# Patient Record
Sex: Female | Born: 1975 | Race: Black or African American | Hispanic: No | Marital: Single | Smoking: Current every day smoker
Health system: Southern US, Community
[De-identification: ages and names within clinical notes are randomized; demographics above are authoritative.]

## PROBLEM LIST (undated history)

## (undated) DIAGNOSIS — R1084 Generalized abdominal pain: Secondary | ICD-10-CM

## (undated) DIAGNOSIS — D869 Sarcoidosis, unspecified: Secondary | ICD-10-CM

## (undated) DIAGNOSIS — Z01419 Encounter for gynecological examination (general) (routine) without abnormal findings: Secondary | ICD-10-CM

## (undated) DIAGNOSIS — K2970 Gastritis, unspecified, without bleeding: Secondary | ICD-10-CM

## (undated) DIAGNOSIS — H547 Unspecified visual loss: Secondary | ICD-10-CM

## (undated) DIAGNOSIS — Z720 Tobacco use: Secondary | ICD-10-CM

## (undated) DIAGNOSIS — R0600 Dyspnea, unspecified: Secondary | ICD-10-CM

## (undated) DIAGNOSIS — O9832 Other infections with a predominantly sexual mode of transmission complicating childbirth: Secondary | ICD-10-CM

## (undated) HISTORY — PX: TUBAL LIGATION: SHX77

## (undated) HISTORY — DX: Sarcoidosis, unspecified: D86.9

## (undated) HISTORY — DX: Dyspnea, unspecified: R06.00

## (undated) HISTORY — DX: Tobacco use: Z72.0

## (undated) HISTORY — DX: Other infections with a predominantly sexual mode of transmission complicating childbirth: O98.32

## (undated) HISTORY — DX: Encounter for gynecological examination (general) (routine) without abnormal findings: Z01.419

## (undated) HISTORY — DX: Unspecified visual loss: H54.7

## (undated) HISTORY — PX: OTHER SURGICAL HISTORY: SHX169

## (undated) HISTORY — DX: Gastritis, unspecified, without bleeding: K29.70

## (undated) HISTORY — DX: Generalized abdominal pain: R10.84

---

## 1998-04-14 ENCOUNTER — Other Ambulatory Visit: Admission: RE | Admit: 1998-04-14 | Discharge: 1998-04-14 | Payer: Self-pay | Admitting: Family Medicine

## 1998-04-17 ENCOUNTER — Inpatient Hospital Stay (HOSPITAL_COMMUNITY): Admission: AD | Admit: 1998-04-17 | Discharge: 1998-04-17 | Payer: Self-pay | Admitting: *Deleted

## 1998-07-04 ENCOUNTER — Inpatient Hospital Stay (HOSPITAL_COMMUNITY): Admission: AD | Admit: 1998-07-04 | Discharge: 1998-07-04 | Payer: Self-pay | Admitting: *Deleted

## 1999-01-04 ENCOUNTER — Inpatient Hospital Stay (HOSPITAL_COMMUNITY): Admission: AD | Admit: 1999-01-04 | Discharge: 1999-01-04 | Payer: Self-pay | Admitting: Obstetrics & Gynecology

## 1999-01-17 ENCOUNTER — Inpatient Hospital Stay (HOSPITAL_COMMUNITY): Admission: AD | Admit: 1999-01-17 | Discharge: 1999-01-17 | Payer: Self-pay | Admitting: Obstetrics

## 1999-01-17 ENCOUNTER — Encounter: Payer: Self-pay | Admitting: Obstetrics

## 1999-01-28 ENCOUNTER — Inpatient Hospital Stay (HOSPITAL_COMMUNITY): Admission: AD | Admit: 1999-01-28 | Discharge: 1999-01-31 | Payer: Self-pay | Admitting: Obstetrics

## 2000-08-22 ENCOUNTER — Observation Stay (HOSPITAL_COMMUNITY): Admission: AD | Admit: 2000-08-22 | Discharge: 2000-08-23 | Payer: Self-pay | Admitting: *Deleted

## 2000-12-28 ENCOUNTER — Emergency Department (HOSPITAL_COMMUNITY): Admission: EM | Admit: 2000-12-28 | Discharge: 2000-12-29 | Payer: Self-pay | Admitting: Emergency Medicine

## 2001-02-21 ENCOUNTER — Encounter: Payer: Self-pay | Admitting: Emergency Medicine

## 2001-02-21 ENCOUNTER — Inpatient Hospital Stay (HOSPITAL_COMMUNITY): Admission: AD | Admit: 2001-02-21 | Discharge: 2001-02-25 | Payer: Self-pay | Admitting: Obstetrics and Gynecology

## 2002-01-06 ENCOUNTER — Encounter: Payer: Self-pay | Admitting: *Deleted

## 2002-01-06 ENCOUNTER — Inpatient Hospital Stay (HOSPITAL_COMMUNITY): Admission: AD | Admit: 2002-01-06 | Discharge: 2002-01-06 | Payer: Self-pay | Admitting: *Deleted

## 2002-02-11 ENCOUNTER — Encounter (INDEPENDENT_AMBULATORY_CARE_PROVIDER_SITE_OTHER): Payer: Self-pay

## 2002-02-11 ENCOUNTER — Inpatient Hospital Stay (HOSPITAL_COMMUNITY): Admission: AD | Admit: 2002-02-11 | Discharge: 2002-02-11 | Payer: Self-pay | Admitting: Obstetrics and Gynecology

## 2002-08-06 ENCOUNTER — Ambulatory Visit (HOSPITAL_COMMUNITY): Admission: RE | Admit: 2002-08-06 | Discharge: 2002-08-06 | Payer: Self-pay | Admitting: *Deleted

## 2002-08-26 ENCOUNTER — Encounter: Admission: RE | Admit: 2002-08-26 | Discharge: 2002-08-26 | Payer: Self-pay | Admitting: *Deleted

## 2002-08-27 ENCOUNTER — Ambulatory Visit (HOSPITAL_COMMUNITY): Admission: RE | Admit: 2002-08-27 | Discharge: 2002-08-27 | Payer: Self-pay | Admitting: Obstetrics and Gynecology

## 2002-09-15 ENCOUNTER — Encounter: Admission: RE | Admit: 2002-09-15 | Discharge: 2002-09-15 | Payer: Self-pay | Admitting: *Deleted

## 2002-10-13 ENCOUNTER — Inpatient Hospital Stay (HOSPITAL_COMMUNITY): Admission: AD | Admit: 2002-10-13 | Discharge: 2002-10-15 | Payer: Self-pay | Admitting: *Deleted

## 2002-10-13 ENCOUNTER — Encounter (INDEPENDENT_AMBULATORY_CARE_PROVIDER_SITE_OTHER): Payer: Self-pay | Admitting: *Deleted

## 2004-01-09 ENCOUNTER — Encounter: Payer: Self-pay | Admitting: Emergency Medicine

## 2004-01-09 ENCOUNTER — Inpatient Hospital Stay (HOSPITAL_COMMUNITY): Admission: AD | Admit: 2004-01-09 | Discharge: 2004-01-09 | Payer: Self-pay | Admitting: Family Medicine

## 2004-01-09 ENCOUNTER — Encounter (INDEPENDENT_AMBULATORY_CARE_PROVIDER_SITE_OTHER): Payer: Self-pay | Admitting: Specialist

## 2004-12-26 ENCOUNTER — Ambulatory Visit (HOSPITAL_COMMUNITY): Admission: RE | Admit: 2004-12-26 | Discharge: 2004-12-26 | Payer: Self-pay | Admitting: *Deleted

## 2005-01-03 ENCOUNTER — Ambulatory Visit: Payer: Self-pay | Admitting: Family Medicine

## 2005-01-17 ENCOUNTER — Ambulatory Visit: Payer: Self-pay | Admitting: Family Medicine

## 2005-01-31 ENCOUNTER — Observation Stay (HOSPITAL_COMMUNITY): Admission: AD | Admit: 2005-01-31 | Discharge: 2005-02-01 | Payer: Self-pay | Admitting: *Deleted

## 2005-01-31 ENCOUNTER — Ambulatory Visit: Payer: Self-pay | Admitting: Family Medicine

## 2005-01-31 ENCOUNTER — Ambulatory Visit: Payer: Self-pay | Admitting: Obstetrics & Gynecology

## 2005-02-28 ENCOUNTER — Ambulatory Visit: Payer: Self-pay | Admitting: Family Medicine

## 2005-03-21 ENCOUNTER — Ambulatory Visit: Payer: Self-pay | Admitting: Family Medicine

## 2005-04-04 ENCOUNTER — Ambulatory Visit (HOSPITAL_COMMUNITY): Admission: RE | Admit: 2005-04-04 | Discharge: 2005-04-04 | Payer: Self-pay | Admitting: Family Medicine

## 2005-04-15 ENCOUNTER — Inpatient Hospital Stay (HOSPITAL_COMMUNITY): Admission: AD | Admit: 2005-04-15 | Discharge: 2005-04-18 | Payer: Self-pay | Admitting: *Deleted

## 2005-04-15 ENCOUNTER — Ambulatory Visit: Payer: Self-pay | Admitting: *Deleted

## 2005-04-16 ENCOUNTER — Encounter (INDEPENDENT_AMBULATORY_CARE_PROVIDER_SITE_OTHER): Payer: Self-pay | Admitting: *Deleted

## 2007-08-06 ENCOUNTER — Encounter (INDEPENDENT_AMBULATORY_CARE_PROVIDER_SITE_OTHER): Payer: Self-pay | Admitting: Nurse Practitioner

## 2007-11-21 ENCOUNTER — Emergency Department (HOSPITAL_COMMUNITY): Admission: EM | Admit: 2007-11-21 | Discharge: 2007-11-21 | Payer: Self-pay | Admitting: Emergency Medicine

## 2007-12-21 ENCOUNTER — Ambulatory Visit: Payer: Self-pay | Admitting: Nurse Practitioner

## 2007-12-21 DIAGNOSIS — K2970 Gastritis, unspecified, without bleeding: Secondary | ICD-10-CM | POA: Insufficient documentation

## 2007-12-21 DIAGNOSIS — K2990 Gastroduodenitis, unspecified, without bleeding: Secondary | ICD-10-CM

## 2007-12-21 DIAGNOSIS — F17200 Nicotine dependence, unspecified, uncomplicated: Secondary | ICD-10-CM

## 2008-05-19 ENCOUNTER — Ambulatory Visit: Payer: Self-pay | Admitting: Nurse Practitioner

## 2008-05-19 DIAGNOSIS — A64 Unspecified sexually transmitted disease: Secondary | ICD-10-CM | POA: Insufficient documentation

## 2008-05-19 DIAGNOSIS — R1084 Generalized abdominal pain: Secondary | ICD-10-CM | POA: Insufficient documentation

## 2008-05-19 DIAGNOSIS — H547 Unspecified visual loss: Secondary | ICD-10-CM

## 2008-05-19 DIAGNOSIS — K029 Dental caries, unspecified: Secondary | ICD-10-CM | POA: Insufficient documentation

## 2008-05-19 LAB — CONVERTED CEMR LAB
ALT: 30 units/L (ref 0–35)
AST: 55 units/L — ABNORMAL HIGH (ref 0–37)
Albumin: 4.5 g/dL (ref 3.5–5.2)
Alkaline Phosphatase: 67 units/L (ref 39–117)
Amylase: 132 units/L — ABNORMAL HIGH (ref 0–105)
Basophils Absolute: 0 10*3/uL (ref 0.0–0.1)
Basophils Relative: 1 % (ref 0–1)
Bilirubin Urine: NEGATIVE
Blood in Urine, dipstick: NEGATIVE
Eosinophils Absolute: 0.2 10*3/uL (ref 0.0–0.7)
Eosinophils Relative: 7 % — ABNORMAL HIGH (ref 0–5)
GC Probe Amp, Genital: NEGATIVE
Glucose, Bld: 68 mg/dL — ABNORMAL LOW (ref 70–99)
Glucose, Urine, Semiquant: NEGATIVE
HCT: 42.7 % (ref 36.0–46.0)
KOH Prep: NEGATIVE
LDL Cholesterol: 58 mg/dL (ref 0–99)
Lipase: 16 units/L (ref 0–75)
Lymphs Abs: 1.4 10*3/uL (ref 0.7–4.0)
MCV: 98.8 fL (ref 78.0–100.0)
Neutrophils Relative %: 29 % — ABNORMAL LOW (ref 43–77)
Platelets: 257 10*3/uL (ref 150–400)
Potassium: 4.5 meq/L (ref 3.5–5.3)
Protein, U semiquant: 30
RDW: 13 % (ref 11.5–15.5)
Sodium: 141 meq/L (ref 135–145)
Specific Gravity, Urine: 1.02
TSH: 1.534 microintl units/mL (ref 0.350–4.50)
Total Bilirubin: 0.4 mg/dL (ref 0.3–1.2)
Total Protein: 8.3 g/dL (ref 6.0–8.3)
Triglycerides: 88 mg/dL (ref <150)
VLDL: 18 mg/dL (ref 0–40)
WBC: 3.2 10*3/uL — ABNORMAL LOW (ref 4.0–10.5)
pH: 6

## 2008-06-09 ENCOUNTER — Telehealth (INDEPENDENT_AMBULATORY_CARE_PROVIDER_SITE_OTHER): Payer: Self-pay | Admitting: Nurse Practitioner

## 2008-06-10 ENCOUNTER — Encounter (INDEPENDENT_AMBULATORY_CARE_PROVIDER_SITE_OTHER): Payer: Self-pay | Admitting: Nurse Practitioner

## 2008-06-27 ENCOUNTER — Encounter (INDEPENDENT_AMBULATORY_CARE_PROVIDER_SITE_OTHER): Payer: Self-pay | Admitting: Nurse Practitioner

## 2008-08-11 ENCOUNTER — Encounter (INDEPENDENT_AMBULATORY_CARE_PROVIDER_SITE_OTHER): Payer: Self-pay | Admitting: *Deleted

## 2009-06-07 ENCOUNTER — Inpatient Hospital Stay (HOSPITAL_COMMUNITY): Admission: EM | Admit: 2009-06-07 | Discharge: 2009-06-09 | Payer: Self-pay | Admitting: Emergency Medicine

## 2009-06-08 ENCOUNTER — Ambulatory Visit: Payer: Self-pay | Admitting: Infectious Diseases

## 2010-04-29 ENCOUNTER — Inpatient Hospital Stay (HOSPITAL_COMMUNITY): Admission: EM | Admit: 2010-04-29 | Discharge: 2010-05-02 | Payer: Self-pay | Admitting: Emergency Medicine

## 2010-04-29 ENCOUNTER — Ambulatory Visit: Payer: Self-pay | Admitting: Pulmonary Disease

## 2010-05-01 ENCOUNTER — Encounter: Payer: Self-pay | Admitting: Adult Health

## 2010-05-02 ENCOUNTER — Encounter (INDEPENDENT_AMBULATORY_CARE_PROVIDER_SITE_OTHER): Payer: Self-pay | Admitting: Internal Medicine

## 2010-05-02 ENCOUNTER — Encounter: Payer: Self-pay | Admitting: Pulmonary Disease

## 2010-05-18 ENCOUNTER — Telehealth: Payer: Self-pay | Admitting: Adult Health

## 2010-05-24 ENCOUNTER — Ambulatory Visit: Payer: Self-pay | Admitting: Pulmonary Disease

## 2010-05-24 DIAGNOSIS — D869 Sarcoidosis, unspecified: Secondary | ICD-10-CM

## 2010-05-24 DIAGNOSIS — R0602 Shortness of breath: Secondary | ICD-10-CM | POA: Insufficient documentation

## 2010-05-28 ENCOUNTER — Telehealth (INDEPENDENT_AMBULATORY_CARE_PROVIDER_SITE_OTHER): Payer: Self-pay | Admitting: *Deleted

## 2010-05-28 LAB — CONVERTED CEMR LAB
ALT: 36 units/L — ABNORMAL HIGH (ref 0–35)
AST: 59 units/L — ABNORMAL HIGH (ref 0–37)
Total Bilirubin: 0.5 mg/dL (ref 0.3–1.2)
Total Protein: 8.3 g/dL (ref 6.0–8.3)

## 2010-05-29 ENCOUNTER — Ambulatory Visit: Payer: Self-pay | Admitting: Pulmonary Disease

## 2010-07-09 ENCOUNTER — Ambulatory Visit: Payer: Self-pay | Admitting: Pulmonary Disease

## 2010-09-25 ENCOUNTER — Telehealth: Payer: Self-pay | Admitting: Adult Health

## 2010-12-09 ENCOUNTER — Encounter: Payer: Self-pay | Admitting: *Deleted

## 2010-12-18 NOTE — Progress Notes (Signed)
Summary: nos appt  Phone Note Call from Patient   Caller: [email or web address]  Call For: parrett Summary of Call: LMTCB x2 to rsc nos from 6/30. Initial call taken by: Darletta Moll,  May 18, 2010 2:57 PM

## 2010-12-18 NOTE — Progress Notes (Signed)
Summary: nos appt  Phone Note Call from Patient   Caller: [email or web address]  Call For: parrett Summary of Call: ATC pt to rsc nos from 11/7, pt doesn't reside at number listed. Initial call taken by: Darletta Moll,  September 25, 2010 9:43 AM

## 2010-12-18 NOTE — Progress Notes (Signed)
Summary: sarcoid rash  Phone Note Outgoing Call Call back at [phone]   Call placed by: Boone Master CNA/MA,  May 28, 2010 12:57 PM Call placed to: Patient Summary of Call: called spoke with patient re: lab and cxr results.  pt c/o sarcoid rash on her back, legs and beginning to show on her arms.  pt states that her SOB has improved but is still present.  please advise, thanks! Initial call taken by: Boone Master CNA/MA,  May 28, 2010 12:59 PM  Follow-up for Phone Call        do not remember a rash at ov  if new onset rash, will need ov  may not be related to sarcoid, not characteristic to have generalized rash w/ sarcoid.  suppose to see Vassie Loll in follow up , may need ov w/ him to decide if tx w/ sarcoid necessary to start.  Follow-up by: Rubye Oaks NP,  May 28, 2010 2:24 PM  Additional Follow-up for Phone Call Additional follow up Details #1::        Spoke with pt.  She states that rash is new.  I advised will need ov- offered appt with RA in [place] for tommorrow but pt refused.  First available RA after that was 7/19, so sched pt to see TP tommorrow am at 10 am. Additional Follow-up by: Vernie Murders,  May 28, 2010 3:01 PM

## 2010-12-18 NOTE — Assessment & Plan Note (Signed)
Summary: NP follow up - sarcoid rash   CC:  sarcoid rash on back and arms and legs x1week.  History of Present Illness: 35 yo female seen for initial pulmonary consult duirng hospitalization 04/29/10 for Interstitial lung dz.   May 24, 2010 --Pt was admitted 04/29/10 for increased dyspnea. CXR and CT showed interstitial lung dz. Pt underwent FOB w/ bx.  Path showed several non caseating granulomata associated w/ mulitnucleated giant cells. Appearance favored Sarcoidosis. ACE level was 212. PT was tx w/ pulmonary hygiene regimen. Discharged on steroid taper which she has now finished. She does have intermittent dyspnea, wears out easily. She does continue to smoke. and drink alcohol daily. She denies any cocaine use since discharge. She is not ready to quit smoking yet. We discussed Sarcoid dz process. Denies known FH. Hospital workup showed neg HIV, Hepatitis panel neg, Mild LFTs increase. Denies chest pain, hemoptysis, fever, n/v/d, edema, headache. PFT showed FEV1 at 1.64 (56%), positive BD response. ratio of 81. DLCO 43  May 29, 2010 --Returns for work in visit. Contnues to have dyspnea, wears out easily, low energy, dry cough. She has noticed several hypopigmented spots on legs and arms over last visit. Concerned that this is from sarcoid. Not sure how long she has had this but noticed if after last visit in office last week. Has several bug bites as well that is pruritic. Denies chest pain, dyspnea, orthopnea, hemoptysis, fever, n/v/d, edema, headache. She continues to smoke and drink daily. Not ready to quit but hs cut down. Has used drugs since hospital discharge. Denies chest pain, dyspnea, orthopnea, hemoptysis, fever, n/v/d, edema, headache,visual changes. Has upcoming ov w/ [facility]. We discussed her looking into medicaid-she has no insurance.   Preventive Screening-Counseling & Management  Alcohol-Tobacco     Alcohol type: beer     Smoking Status: current     Smoking Cessation  Counseling: yes     Smoke Cessation Stage: precontemplative     Packs/Day: <0.25     Year Started: 1993  Medications Prior to Update: 1)  Nexium 40 Mg  Cpdr (Esomeprazole Magnesium) .Marland Kitchen.. 1 Tablet By Mouth Two Times A Day X 1 Week Then By Mouth Daily 2)  Famotidine 20 Mg Tabs (Famotidine) .... Take 1 Tab By Mouth At Bedtime 3)  Ventolin Hfa 108 (90 Base) Mcg/act Aers (Albuterol Sulfate) .... Inhale 2 Puffs Every Four Hours As Needed  Current Medications (verified): 1)  Nexium 40 Mg  Cpdr (Esomeprazole Magnesium) .Marland Kitchen.. 1 Tablet By Mouth Two Times A Day X 1 Week Then By Mouth Daily 2)  Famotidine 20 Mg Tabs (Famotidine) .... Take 1 Tab By Mouth At Bedtime 3)  Ventolin Hfa 108 (90 Base) Mcg/act Aers (Albuterol Sulfate) .... Inhale 2 Puffs Every Four Hours As Needed  Allergies (verified): No Known Drug Allergies  Past History:  Family History: Last updated: 05/24/2010 asthma - mat aunts x2 rheumatism - MGF cancer - mat aunts x2 w/ breast cancer DM - MGF, cousin  Social History: Last updated: 05/29/2010 6 children Current Smoker - 1 cig per day Alcohol use-yes ( six pack per day) Drug use-hx last use june 2011.  L[place] in [place].  Single   Risk Factors: Caffeine Use: 1 (12/21/2007)  Risk Factors: Smoking Status: current (05/29/2010) Packs/Day: <0.25 (05/29/2010)  Past Medical History:  PULMONARY SARCOIDOSIS (ICD-135) --Pt was admitted 04/29/10 for increased dyspnea. CXR and CT showed interstitial lung dz. Pt underwent FOB w/ bx.  Path showed several non caseating granulomata associated w/  mulitnucleated giant cells. Appearance favored Sarcoidosis. ACE level was 212. PFT showed FEV1 at 1.64 (56%), positive BD response. ratio of 81. DLCO 43 >>>Prednisone 10mg  once daily started May 29, 2010   DYSPNEA (ICD-786.05) ABDOMINAL PAIN, GENERALIZED (ICD-789.07) SEXUALLY TRANSMITTED DISEASE (ICD-099.9) VISUAL ACUITY, DECREASED (ICD-369.9) DENTAL CARIES (ICD-521.00) ROUTINE  GYNECOLOGICAL EXAMINATION (ICD-V72.31) TOBACCO ABUSE (ICD-305.1) GASTRITIS (ICD-535.50)    Social History: 6 children Current Smoker - 1 cig per day Alcohol use-yes ( six pack per day) Drug use-hx last use june 2011.  Lives in [place].  Single   Packs/Day:  <0.25  Review of Systems      See HPI  Vital Signs:  Patient profile:   35 year old female Height:      66 inches Weight:      134.50 pounds BMI:     21.79 O2 Sat:      93 % on Room air Temp:     98.6 degrees F oral Pulse rate:   86 / minute BP sitting:   96 / 60  Vitals Entered By: Boone Master CNA/MA (May 29, 2010 10:08 AM)  O2 Flow:  Room air CC: sarcoid rash on back, arms and legs x1week Is Patient Diabetic? No Comments Medications reviewed with patient Daytime contact number verified with patient. Boone Master CNA/MA  May 29, 2010 10:09 AM    Physical Exam  Additional Exam:  GEN: A/Ox3; pleasant , NAD HEENT:  [place]/AT, , EACs-clear, TMs-wnl, NOSE-clear, THROAT-clear NECK:  Supple w/ fair ROM; no JVD; normal carotid impulses w/o bruits; no thyromegaly or nodules palpated; no lymphadenopathy. RESP  CTA w/ decreased BS in bases  CARD:  RRR, no m/r/g   GI:   Soft & nt; nml bowel sounds; no organomegaly or masses detected. Musco: Warm bil,  no calf tenderness edema, clubbing, pulses intact Neuro: intact w/ no focal deficits noted.  SKin: several scattered hypopigmented spots along lower legs and forearms. no nodules noted.    Impression & Recommendations:  Problem # 1:  PULMONARY SARCOIDOSIS (ICD-135) Skin hypopigmented areas does not look like sarcoid related, no erythema nodosum noted.  will cont to follow .  She continues to have pulmonary complaints w/ fatigue, dyspnea and dry cough  will start steroids w/ prednisone 10mg  once daily  steroid education given.  follow up Dr. Vassie Loll in 1 month Dr. Vassie Loll   Medications Added to Medication List This Visit: 1)  Prednisone 10 Mg Tabs (Prednisone) .Marland Kitchen.. 1 by  mouth once daily  Complete Medication List: 1)  Nexium 40 Mg Cpdr (Esomeprazole magnesium) .Marland Kitchen.. 1 tablet by mouth two times a day x 1 week then by mouth daily 2)  Famotidine 20 Mg Tabs (Famotidine) .... Take 1 tab by mouth at bedtime 3)  Ventolin Hfa 108 (90 Base) Mcg/act Aers (Albuterol sulfate) .... Inhale 2 puffs every four hours as needed 4)  Prednisone 10 Mg Tabs (Prednisone) .Marland Kitchen.. 1 by mouth once daily  Other Orders: Est. Patient Level II ([address])  Patient Instructions: 1)  Begin Prednisone 10mg  once daily -take w/ food in am.  2)  Follow up w/ Dr. Vassie Loll in 1 month as scheduled.  3)  Please contact office for sooner follow up if symptoms do not improve or worsen  Prescriptions: PREDNISONE 10 MG TABS (PREDNISONE) 1 by mouth once daily  #30 x 1   Entered and Authorized by:   Rubye Oaks NP   Signed by:   Imraan Wendell NP on 05/29/2010   Method used:   Electronically  to        [facility] [phone]* (retail)       [address]       [place], [place]  [address]       Ph: [phone]       Fax: [phone]   RxID:   [id number]

## 2010-12-18 NOTE — Assessment & Plan Note (Signed)
Summary: 6 weeks/apc   Visit Type:  Follow-up Primary Angelette Ganus/Referring Meeah Totino:  n/a  CC:  Pt here for follow up. Pt states Prednisone helps with breathing.  History of Present Illness: 34/F, smoker , polysubstance abuse for FU of sarcoidosis  May 24, 2010 --Pt was admitted 04/29/10 for increased dyspnea. CXR and CT showed interstitial lung dz. Pt underwent FOB w/ bx.  Path showed several non caseating granulomata associated w/ mulitnucleated giant cells. Appearance favored Sarcoidosis. ACE level was 212. Discharged on steroid taper which she has now finished. She does have intermittent dyspnea, wears out easily. She does continue to smoke. and drink alcohol daily. She denies any cocaine use since discharge. She is not ready to quit smoking yet.  Hospital workup showed neg HIV, Hepatitis panel neg, Mild LFTs increase.  PFT showed FEV1 at 1.64 (56%), positive BD response. ratio of 81. DLCO 43  May 29, 2010 --Returns for work in visit. Contnues to have dyspnea, wears out easily, low energy, dry cough. She has noticed several hypopigmented spots on legs and arms over last visit. Concerned that this is from sarcoid-- > startde on 10 mg prednisone  July 09, 2010 2:29 PM  -ran out of prednisone, smokes 1-2 cigs/d,  no ETOH, c/o incresaed dyspnea CXR - unchanged interstitial pattern  Preventive Screening-Counseling & Management  Alcohol-Tobacco     Alcohol drinks/day: 0     Smoking Status: current     Packs/Day: <0.25     Year Started: 1993  Caffeine-Diet-Exercise     Caffeine use/day: 1  Current Medications (verified): 1)  Ventolin Hfa 108 (90 Base) Mcg/act Aers (Albuterol Sulfate) .... Inhale 2 Puffs Every Four Hours As Needed  Allergies (verified): No Known Drug Allergies  Past History:  Past Surgical History: Last updated: 12/21/2007 Tubal ligation  Social History: Last updated: 05/29/2010 6 children Current Smoker - 1 cig per day Alcohol use-yes ( six pack per  day) Drug use-hx last use june 2011.  Lives in [place].  Single   Social History: Alcohol drinks/day:  0  Review of Systems       The patient complains of dyspnea on exertion.  The patient denies anorexia, fever, weight loss, weight gain, vision loss, decreased hearing, hoarseness, chest pain, syncope, peripheral edema, prolonged cough, headaches, hemoptysis, abdominal pain, melena, hematochezia, severe indigestion/heartburn, hematuria, muscle weakness, suspicious skin lesions, transient blindness, difficulty walking, depression, unusual weight change, and abnormal bleeding.    Vital Signs:  Patient profile:   35 year old female Height:      66 inches Weight:      134 pounds BMI:     21.71 O2 Sat:      98 % on Room air Temp:     98.3 degrees F oral Pulse rate:   80 / minute BP sitting:   128 / 76  (left arm) Cuff size:   regular  Vitals Entered By: Zackery Barefoot CMA (July 09, 2010 2:08 PM)  O2 Flow:  Room air CC: Pt here for follow up. Pt states Prednisone helps with breathing Comments Medications reviewed with patient Verified contact number and pharmacy with patient Zackery Barefoot CMA  July 09, 2010 2:10 PM    Physical Exam  Additional Exam:  GEN: A/Ox3; pleasant , NAD HEENT:  [place]/AT, , EACs-clear, TMs-wnl, NOSE-clear, THROAT-clear NECK:  Supple w/ fair ROM; no JVD; normal carotid impulses w/o bruits; no thyromegaly or nodules palpated; no lymphadenopathy. RESP  CTA w/ decreased BS in bases  CARD:  RRR,  no m/r/g   Musco: Warm bil,  no calf tenderness edema, clubbing, pulses intact SKin: several scattered hypopigmented spots along lower legs and forearms. no nodules noted.    CXR  Procedure date:  07/09/2010  Findings:      IMPRESSION: Chronic scarring particularly the bases.  No superimposed acute abnormality.  Impression & Recommendations:  Problem # 1:  PULMONARY SARCOIDOSIS (ICD-135) stay on 10 mg pred x 3 months - then based on symptoms , atempt slow  taper.  Problem # 2:  TOBACCO ABUSE (ICD-305.1) -discussed cessation. Not ready to commit to a quit date  Problem # 3:  GASTRITIS (ICD-535.50) omeprazole for GERD The following medications were removed from the medication list:    Nexium 40 Mg Cpdr (Esomeprazole magnesium) .Marland Kitchen... 1 tablet by mouth two times a day x 1 week then by mouth daily    Famotidine 20 Mg Tabs (Famotidine) .Marland Kitchen... Take 1 tab by mouth at bedtime Her updated medication list for this problem includes:    Omeprazole Magnesium 20.6 (20 Base) Mg Cpdr (Omeprazole magnesium) ..... Once daily  Medications Added to Medication List This Visit: 1)  Prednisone 10 Mg Tabs (Prednisone) .... Once daily with food 2)  Omeprazole Magnesium 20.6 (20 Base) Mg Cpdr (Omeprazole magnesium) .... Once daily  Other Orders: Est. Patient Level III (04540) Prescription Created Electronically [phone]) T-2 View CXR ([id number])  Patient Instructions: 1)  Copy sent to: [facility] 2)  Please schedule a follow-up appointment in 2 months with TP 3)  QUIT smoking ! 4)  A chest x-ray has been recommended.  Your imaging study may require preauthorization.  5)  Stay on 10 mg prednisone  - Rx sent to pharmacy Prescriptions: OMEPRAZOLE MAGNESIUM 20.6 (20 BASE) MG CPDR (OMEPRAZOLE MAGNESIUM) once daily  #30 x 2   Entered and Authorized by:   Comer Locket Vassie Loll MD   Signed by:   Comer Locket Vassie Loll MD on 07/09/2010   Method used:   Electronically to        [facility] [phone]* (retail)       [address]       [place], [place]  [address]       Ph: [phone]       Fax: [phone]   RxID:   [id number] PREDNISONE 10 MG TABS (PREDNISONE) once daily with food  #60 x 1   Entered and Authorized by:   Comer Locket. Vassie Loll MD   Signed by:   Comer Locket Vassie Loll MD on 07/09/2010   Method used:   Electronically to        [facility] [phone]* (retail)       [address]       [place], [place]  [address]       Ph: [phone]       Fax: [phone]   RxID:    [id number]

## 2010-12-18 NOTE — Assessment & Plan Note (Signed)
Summary: NP follow up - post hosp   CC:  post hosp follow up - states her SOB has increased since finishing the pred taper x2weeks ago, also wheezing.  denies cough, and fc/s.  History of Present Illness: 35 yo female seen for initial pulmonary consult duirng hospitalization 04/29/10 for Interstitial lung dz.   May 24, 2010 --Pt was admitted 04/29/10 for increased dyspnea. CXR and CT showed interstitial lung dz. Pt underwent FOB w/ bx.  Path showed several non caseating granulomata associated w/ mulitnucleated giant cells. Appearance favored Sarcoidosis. ACE level was 212. PT was tx w/ pulmonary hygiene regimen. Discharged on steroid taper which she has now finished. She does have intermittent dyspnea, wears out easily. She does continue to smoke. and drink alcohol daily. She denies any cocaine use since discharge. She is not ready to quit smoking yet. We discussed Sarcoid dz process. Denies known FH. Hospital workup showed neg HIV, Hepatitis panel neg, Mild LFTs increase. Denies chest pain, hemoptysis, fever, n/v/d, edema, headache. PFT showed FEV1 at 1.64 (56%), positive BD response. ratio of 81. DLCO 43  Medications Prior to Update: 1)  Nexium 40 Mg  Cpdr (Esomeprazole Magnesium) .Marland Kitchen.. 1 Tablet By Mouth Two Times A Day X 1 Week Then By Mouth Daily  Current Medications (verified): 1)  Nexium 40 Mg  Cpdr (Esomeprazole Magnesium) .Marland Kitchen.. 1 Tablet By Mouth Two Times A Day X 1 Week Then By Mouth Daily 2)  Famotidine 20 Mg Tabs (Famotidine) .... Take 1 Tab By Mouth At Bedtime 3)  Ventolin Hfa 108 (90 Base) Mcg/act Aers (Albuterol Sulfate) .... Inhale 2 Puffs Every Four Hours As Needed  Allergies (verified): No Known Drug Allergies  Past History:  Past Surgical History: Last updated: 12/21/2007 Tubal ligation  Family History: Last updated: 05/24/2010 asthma - mat aunts x2 rheumatism - MGF cancer - mat aunts x2 w/ breast cancer DM - MGF, cousin  Social History: Last updated: 05/24/2010 6  children Current Smoker - 1 cig per day Alcohol use-yes ( six pack per day) Drug use-no  Risk Factors: Smoking Status: current (12/21/2007)  Past Medical History:   PULMONARY SARCOIDOSIS (ICD-135) --Pt was admitted 04/29/10 for increased dyspnea. CXR and CT showed interstitial lung dz. Pt underwent FOB w/ bx.  Path showed several non caseating granulomata associated w/ mulitnucleated giant cells. Appearance favored Sarcoidosis. ACE level was 212 PFT showed FEV1 at 1.64 (56%), positive BD response. ratio of 81. DLCO 43 DYSPNEA (ICD-786.05) ABDOMINAL PAIN, GENERALIZED (ICD-789.07) SEXUALLY TRANSMITTED DISEASE (ICD-099.9) VISUAL ACUITY, DECREASED (ICD-369.9) DENTAL CARIES (ICD-521.00) ROUTINE GYNECOLOGICAL EXAMINATION (ICD-V72.31) TOBACCO ABUSE (ICD-305.1) GASTRITIS (ICD-535.50)    Family History: asthma - mat aunts x2 rheumatism - MGF cancer - mat aunts x2 w/ breast cancer DM - MGF, cousin  Social History: 6 children Current Smoker - 1 cig per day Alcohol use-yes ( six pack per day) Drug use-no  Review of Systems      See HPI  Vital Signs:  Patient profile:   35 year old female Height:      66 inches Weight:      138 pounds BMI:     22.35 O2 Sat:      96 % on Room air Temp:     98.4 degrees F oral Pulse rate:   89 / minute BP sitting:   100 / 78  (left arm) Cuff size:   regular  Vitals Entered By: Boone Master CNA/MA (May 24, 2010 4:13 PM)  O2 Flow:  Room air CC:  post hosp follow up - states her SOB has increased since finishing the pred taper x2weeks ago, also wheezing.  denies cough, fc/s Is Patient Diabetic? No Comments Medications reviewed with patient Daytime contact number verified with patient. Boone Master CNA/MA  May 24, 2010 4:10 PM    Ambulatory Pulse Oximetry  Resting; HR__98___    02 Sat___97__  Lap1 (185 feet)   HR__100___   02 Sat__90___ Lap2 (185 feet)   HR__100___   02 Sat__90___    Lap3 (185 feet)   HR__101___   02  Sat__90___  _X__Test Completed without Difficulty ___Test Stopped due to:  Boone Master CNA/MA  May 24, 2010 4:48 PM    Physical Exam  Additional Exam:  GEN: A/Ox3; pleasant , NAD HEENT:  [place]/AT, , EACs-clear, TMs-wnl, NOSE-clear, THROAT-clear NECK:  Supple w/ fair ROM; no JVD; normal carotid impulses w/o bruits; no thyromegaly or nodules palpated; no lymphadenopathy. RESP  CTA w/ decreased BS in bases  CARD:  RRR, no m/r/g   GI:   Soft & nt; nml bowel sounds; no organomegaly or masses detected. Musco: Warm bil,  no calf tenderness edema, clubbing, pulses intact Neuro: intact w/ no focal deficits noted.    Impression & Recommendations:  Problem # 1:  PULMONARY SARCOIDOSIS (ICD-135) no ambulatory desaturations.  pt education on sarcoid.  advised to set up w/ opthamology for eye exam.  will hold tx at this time, continue to monitor.  follow up 6 weeks Dr. Vassie Loll xray pending.   REC:  MOST IMPORTANT IS TO QUIT SMOKING AND STOP DRUG USE.  At this time we will continue to follow you, no medicine at this point.  follow up Dr. Vassie Loll in 6 weeks. and as needed  Please contact office for sooner follow up if symptoms do not improve or worsen  Orders: Pulse Oximetry, Ambulatory (47829) T-2 View CXR (71020TC) Est. Patient Level IV (56213)  Medications Added to Medication List This Visit: 1)  Famotidine 20 Mg Tabs (Famotidine) .... Take 1 tab by mouth at bedtime 2)  Ventolin Hfa 108 (90 Base) Mcg/act Aers (Albuterol sulfate) .... Inhale 2 puffs every four hours as needed  Other Orders: TLB-Hepatic/Liver Function Pnl ([id number]-[id number])  Patient Instructions: 1)  You have Sarcoidosis-  2)  We would like you to have a eye exam, you will need to see an opthamologist-tell them that you have recently been diagnosed w/ sarcoid .  3)  MOST IMPORTANT IS TO QUIT SMOKING AND STOP DRUG USE.  4)  At this time we will continue to follow you, no medicine at this point.  5)  follow up Dr. Vassie Loll in 6  weeks. and as needed  6)  Please contact office for sooner follow up if symptoms do not improve or worsen

## 2011-01-16 ENCOUNTER — Ambulatory Visit: Payer: Self-pay | Admitting: Adult Health

## 2011-01-18 ENCOUNTER — Other Ambulatory Visit: Payer: Self-pay | Admitting: Pulmonary Disease

## 2011-01-18 ENCOUNTER — Ambulatory Visit (INDEPENDENT_AMBULATORY_CARE_PROVIDER_SITE_OTHER): Payer: Self-pay | Admitting: Adult Health

## 2011-01-18 ENCOUNTER — Other Ambulatory Visit: Payer: Self-pay | Admitting: Internal Medicine

## 2011-01-18 ENCOUNTER — Encounter: Payer: Self-pay | Admitting: Adult Health

## 2011-01-18 ENCOUNTER — Ambulatory Visit (INDEPENDENT_AMBULATORY_CARE_PROVIDER_SITE_OTHER)
Admission: RE | Admit: 2011-01-18 | Discharge: 2011-01-18 | Disposition: A | Discharge status: Home / Self Care | Payer: Self-pay | Source: Ambulatory Visit | Attending: Internal Medicine | Admitting: Internal Medicine

## 2011-01-18 DIAGNOSIS — D869 Sarcoidosis, unspecified: Secondary | ICD-10-CM

## 2011-01-29 NOTE — Assessment & Plan Note (Signed)
Summary: Acute NP office visit - sarcoid   Primary Provider/Referring Provider:  n/a  CC:  increased SOB, tightness in chest, prod cough with clear mucus, increased fatigue x7month, and states this began when she finished last rx for prednisone.  History of Present Illness: 35/F, smoker Hx of polysubstance abuse for FU of sarcoidosis  May 24, 2010 --Pt was admitted 04/29/10 for increased dyspnea. CXR and CT showed interstitial lung dz. Pt underwent FOB w/ bx.  Path showed several non caseating granulomata associated w/ mulitnucleated giant cells. Appearance favored Sarcoidosis. ACE level was 212. Discharged on steroid taper which she has now finished. She does have intermittent dyspnea, wears out easily. She does continue to smoke. and drink alcohol daily. She denies any cocaine use since discharge. She is not ready to quit smoking yet.  Hospital workup showed neg HIV, Hepatitis panel neg, Mild LFTs increase.  PFT showed FEV1 at 1.64 (56%), positive BD response. ratio of 81. DLCO 43  May 29, 2010 --Returns for work in visit. Contnues to have dyspnea, wears out easily, low energy, dry cough. She has noticed several hypopigmented spots on legs and arms over last visit. Concerned that this is from sarcoid-- > startde on 10 mg prednisone  July 09, 2010 2:29 PM  -ran out of prednisone, smokes 1-2 cigs/d,  no ETOH, c/o incresaed dyspnea CXR - unchanged interstitial pattern>>rec to stay on pred 10mg  once daily    January 18, 2011 --Presents for an acute office visit.Complains of increased SOB, tightness in chest, prod cough with clear mucus, increased fatigue x24month. She is very nervous and jittery today, asked if she is using drugs again -she denies. She stopped prednisone few months ago.  Drinks alcohol daily. No otc used. No discolored mucus or fever.   Medications Prior to Update: 1)  Ventolin Hfa 108 (90 Base) Mcg/act Aers (Albuterol Sulfate) .... Inhale 2 Puffs Every Four Hours As  Needed  Current Medications (verified): 1)  Ventolin Hfa 108 (90 Base) Mcg/act Aers (Albuterol Sulfate) .... Inhale 2 Puffs Every Four Hours As Needed  Allergies (verified): No Known Drug Allergies  Past History:  Past Medical History: Last updated: 05/29/2010  PULMONARY SARCOIDOSIS (ICD-135) --Pt was admitted 04/29/10 for increased dyspnea. CXR and CT showed interstitial lung dz. Pt underwent FOB w/ bx.  Path showed several non caseating granulomata associated w/ mulitnucleated giant cells. Appearance favored Sarcoidosis. ACE level was 212. PFT showed FEV1 at 1.64 (56%), positive BD response. ratio of 81. DLCO 43 >>>Prednisone 10mg  once daily started May 29, 2010   DYSPNEA (ICD-786.05) ABDOMINAL PAIN, GENERALIZED (ICD-789.07) SEXUALLY TRANSMITTED DISEASE (ICD-099.9) VISUAL ACUITY, DECREASED (ICD-369.9) DENTAL CARIES (ICD-521.00) ROUTINE GYNECOLOGICAL EXAMINATION (ICD-V72.31) TOBACCO ABUSE (ICD-305.1) GASTRITIS (ICD-535.50)    Past Surgical History: Last updated: 12/21/2007 Tubal ligation  Family History: Last updated: 05/24/2010 asthma - mat aunts x2 rheumatism - MGF cancer - mat aunts x2 w/ breast cancer DM - MGF, cousin  Social History: Last updated: 05/29/2010 6 children Current Smoker - 1 cig per day Alcohol use-yes ( six pack per day) Drug use-hx last use june 2011.  Lives in [place].  Single   Risk Factors: Smoking Status: current (07/09/2010) Packs/Day: <0.25 (07/09/2010)  Review of Systems      See HPI  Vital Signs:  Patient profile:   35 year old female Height:      66 inches Weight:      138.50 pounds BMI:     22.44 O2 Sat:      98 %  on Room air Temp:     97.7 degrees F oral Pulse rate:   77 / minute BP sitting:   104 / 76  (left arm) Cuff size:   regular  Vitals Entered By: Boone Master CNA/MA (January 18, 2011 12:05 PM)  O2 Flow:  Room air CC: increased SOB, tightness in chest, prod cough with clear mucus, increased fatigue x1month, states  this began when she finished last rx for prednisone Is Patient Diabetic? No Comments Medications reviewed with patient Daytime contact number verified with patient. Boone Master CNA/MA  January 18, 2011 12:05 PM    Physical Exam  Additional Exam:  GEN: A/Ox3; pleasant , NAD HEENT:  Rachel/AT, , EACs-clear, TMs-wnl, NOSE-clear, THROAT-clear NECK:  Supple w/ fair ROM; no JVD; normal carotid impulses w/o bruits; no thyromegaly or nodules palpated; no lymphadenopathy. RESP  CTA w/ decreased BS in bases  CARD:  RRR, no m/r/g   Musco: Warm bil,  no calf tenderness edema, clubbing, pulses intact SKin: no rash or . nodules noted.Multiple eccymotics areas along forearm-wrist/antecubital area  Psych: anxious , jittery    Impression & Recommendations:  Problem # 1:  PULMONARY SARCOIDOSIS (ICD-135) Flare discussed importance of not stopping steroids abruptly.  will check xray and ACE /LFTs  Plan :  Restart Prednsone 20mg  once daily for 2 weeks then 1/2 once daily and hold at this dose.  follow up Dr. Vassie Loll in 3 weeks I will call with labs and xray results Very IMPORTANT TO STOP SMOKING. Marland KitchenPlease contact office for sooner follow up if symptoms do not improve or worsen   Medications Added to Medication List This Visit: 1)  Prednisone 20 Mg Tabs (Prednisone) .Marland Kitchen.. 1 by mouth once daily x 2 weeks , then 1/2 once daily  Other Orders: T-2 View CXR (71020TC) TLB-Hepatic/Liver Function Pnl (80076-HEPATIC) T-Angiotensin i-Converting Enzyme (16109-60454) TLB-BMP (Basic Metabolic Panel-BMET) (80048-METABOL) Albuterol Sulfate Sol 1mg  unit dose (U9811) Nebulizer Tx (91478) Est. Patient Level IV (29562)  Patient Instructions: 1)  Restart Prednsone 20mg  once daily for 2 weeks then 1/2 once daily and hold at this dose.  2)  follow up Dr. Vassie Loll in 3 weeks 3)  I will call with labs and xray results 4)  Very IMPORTANT TO STOP SMOKING. 5)  .Please contact office for sooner follow up if symptoms do not  improve or worsen  Prescriptions: PREDNISONE 20 MG TABS (PREDNISONE) 1 by mouth once daily x 2 weeks , then 1/2 once daily  #60 x 1   Entered and Authorized by:   Rubye Oaks NP   Signed by:   Rubye Oaks NP on 01/18/2011   Method used:   Electronically to        [facility] [phone]* (retail)       [address]       [place], [place]  [address]       Ph: [phone]       Fax: [phone]   RxID:   [id number]    Medication Administration  Medication # 1:    Medication: Albuterol Sulfate Sol 1mg  unit dose    Diagnosis: PULMONARY SARCOIDOSIS (ICD-135)    Dose: 1 vial    Route: inhaled    Exp Date: 11-2011    Lot #: [id number]    Mfr: nephron    Patient tolerated medication without complications    Given by: Boone Master CNA/MA (January 18, 2011 4:19 PM)  Orders Added: 1)  T-2 View CXR [71020TC] 2)  TLB-Hepatic/Liver Function Pnl [80076-HEPATIC] 3)  T-Angiotensin i-Converting Enzyme [16109-60454] 4)  TLB-BMP (Basic Metabolic Panel-BMET) [80048-METABOL] 5)  Albuterol Sulfate Sol 1mg  unit dose [J7613] 6)  Nebulizer Tx [94640] 7)  Est. Patient Level IV [09811]

## 2011-02-03 LAB — GLUCOSE, CAPILLARY
Glucose-Capillary: 112 mg/dL — ABNORMAL HIGH (ref 70–99)
Glucose-Capillary: 149 mg/dL — ABNORMAL HIGH (ref 70–99)

## 2011-02-03 LAB — CULTURE, RESPIRATORY W GRAM STAIN

## 2011-02-03 LAB — FUNGUS CULTURE W SMEAR

## 2011-02-03 LAB — AFB CULTURE WITH SMEAR (NOT AT ARMC): Acid Fast Smear: NONE SEEN

## 2011-02-04 LAB — CBC
HCT: 35.7 % — ABNORMAL LOW (ref 36.0–46.0)
HCT: 39.9 % (ref 36.0–46.0)
Hemoglobin: 12.3 g/dL (ref 12.0–15.0)
Hemoglobin: 13.7 g/dL (ref 12.0–15.0)
MCHC: 34.5 g/dL (ref 30.0–36.0)
MCV: 97.4 fL (ref 78.0–100.0)
MCV: 97.9 fL (ref 78.0–100.0)
RBC: 3.65 MIL/uL — ABNORMAL LOW (ref 3.87–5.11)
RBC: 4.09 MIL/uL (ref 3.87–5.11)
RDW: 13.7 % (ref 11.5–15.5)
WBC: 4.4 10*3/uL (ref 4.0–10.5)

## 2011-02-04 LAB — COMPREHENSIVE METABOLIC PANEL
Alkaline Phosphatase: 114 U/L (ref 39–117)
BUN: 6 mg/dL (ref 6–23)
BUN: 8 mg/dL (ref 6–23)
CO2: 22 mEq/L (ref 19–32)
CO2: 24 mEq/L (ref 19–32)
Calcium: 8.5 mg/dL (ref 8.4–10.5)
Chloride: 106 mEq/L (ref 96–112)
Creatinine, Ser: 0.68 mg/dL (ref 0.4–1.2)
Creatinine, Ser: 0.81 mg/dL (ref 0.4–1.2)
GFR calc non Af Amer: >60 mL/min (ref >60)
GFR calc non Af Amer: >60 mL/min (ref >60)
Glucose, Bld: 222 mg/dL — ABNORMAL HIGH (ref 70–99)
Sodium: 133 mEq/L — ABNORMAL LOW (ref 135–145)
Total Bilirubin: 0.4 mg/dL (ref 0.3–1.2)
Total Protein: 7 g/dL (ref 6.0–8.3)

## 2011-02-04 LAB — CULTURE, BLOOD (ROUTINE X 2)
Culture: NO GROWTH
Culture: NO GROWTH

## 2011-02-04 LAB — HEPATITIS PANEL, ACUTE

## 2011-02-04 LAB — DIFFERENTIAL
Basophils Absolute: 0 10*3/uL (ref 0.0–0.1)
Basophils Relative: 1 % (ref 0–1)
Eosinophils Absolute: 0 10*3/uL (ref 0.0–0.7)
Eosinophils Relative: 5 % (ref 0–5)
Lymphocytes Relative: 27 % (ref 12–46)
Lymphs Abs: 0.9 10*3/uL (ref 0.7–4.0)
Monocytes Relative: 5 % (ref 3–12)
Neutro Abs: 2.2 10*3/uL (ref 1.7–7.7)
Neutro Abs: 2.4 10*3/uL (ref 1.7–7.7)
Neutrophils Relative %: 67 % (ref 43–77)

## 2011-02-04 LAB — POCT CARDIAC MARKERS: Myoglobin, poc: 120 ng/mL (ref 12–200)

## 2011-02-04 LAB — EXPECTORATED SPUTUM ASSESSMENT W GRAM STAIN, RFLX TO RESP C

## 2011-02-04 LAB — BLOOD GAS, ARTERIAL
Bicarbonate: 22.5 mEq/L (ref 20.0–24.0)
FIO2: 0.21 %
Patient temperature: 98.6
TCO2: 23.6 mmol/L (ref 0–100)
pCO2 arterial: 35.4 mmHg (ref 35.0–45.0)
pH, Arterial: 7.418 — ABNORMAL HIGH (ref 7.350–7.400)

## 2011-02-04 LAB — D-DIMER, QUANTITATIVE: D-Dimer, Quant: 1.06 ug/mL-FEU — ABNORMAL HIGH (ref 0.00–0.48)

## 2011-02-04 LAB — HIV ANTIBODY (ROUTINE TESTING W REFLEX): HIV: NONREACTIVE

## 2011-02-04 LAB — HEMOCCULT GUIAC POC 1CARD (OFFICE)
Fecal Occult Bld: NEGATIVE
Fecal Occult Bld: NEGATIVE

## 2011-02-04 LAB — GLUCOSE, CAPILLARY: Glucose-Capillary: 181 mg/dL — ABNORMAL HIGH (ref 70–99)

## 2011-02-04 LAB — ANA: Anti Nuclear Antibody(ANA): POSITIVE — AB

## 2011-02-04 LAB — C-REACTIVE PROTEIN: CRP: 0.6 mg/dL — ABNORMAL HIGH (ref <0.6)

## 2011-02-04 LAB — RAPID URINE DRUG SCREEN, HOSP PERFORMED
Amphetamines: NOT DETECTED
Cocaine: POSITIVE — AB
Tetrahydrocannabinol: NOT DETECTED

## 2011-02-04 LAB — CULTURE, RESPIRATORY W GRAM STAIN

## 2011-02-04 LAB — ANGIOTENSIN CONVERTING ENZYME: Angiotensin-Converting Enzyme: 212 U/L — ABNORMAL HIGH (ref 9–67)

## 2011-02-11 ENCOUNTER — Encounter: Payer: Self-pay | Admitting: Pulmonary Disease

## 2011-02-12 ENCOUNTER — Ambulatory Visit: Payer: Self-pay | Admitting: Pulmonary Disease

## 2011-02-24 LAB — CBC
MCHC: 34.1 g/dL (ref 30.0–36.0)
MCV: 100.1 fL — ABNORMAL HIGH (ref 78.0–100.0)
MCV: 99.7 fL (ref 78.0–100.0)
Platelets: 178 10*3/uL (ref 150–400)
Platelets: 186 10*3/uL (ref 150–400)
RBC: 3.44 MIL/uL — ABNORMAL LOW (ref 3.87–5.11)
RDW: 12.9 % (ref 11.5–15.5)
WBC: 2.9 10*3/uL — ABNORMAL LOW (ref 4.0–10.5)
WBC: 5 10*3/uL (ref 4.0–10.5)

## 2011-02-24 LAB — DIFFERENTIAL
Basophils Absolute: 0 10*3/uL (ref 0.0–0.1)
Eosinophils Relative: 4 % (ref 0–5)
Lymphocytes Relative: 15 % (ref 12–46)
Neutro Abs: 3.8 10*3/uL (ref 1.7–7.7)
Neutrophils Relative %: 76 % (ref 43–77)

## 2011-02-24 LAB — URINALYSIS, ROUTINE W REFLEX MICROSCOPIC
Bilirubin Urine: NEGATIVE
Nitrite: NEGATIVE
Specific Gravity, Urine: 1.023 (ref 1.005–1.030)
Urobilinogen, UA: 1 mg/dL (ref 0.0–1.0)

## 2011-02-24 LAB — COMPREHENSIVE METABOLIC PANEL
ALT: 24 U/L (ref 0–35)
AST: 38 U/L — ABNORMAL HIGH (ref 0–37)
CO2: 25 mEq/L (ref 19–32)
Chloride: 106 mEq/L (ref 96–112)
GFR calc Af Amer: >60 mL/min (ref >60)
GFR calc non Af Amer: >60 mL/min (ref >60)
Sodium: 138 mEq/L (ref 135–145)
Total Bilirubin: 0.3 mg/dL (ref 0.3–1.2)

## 2011-02-24 LAB — BASIC METABOLIC PANEL
BUN: 5 mg/dL — ABNORMAL LOW (ref 6–23)
BUN: 8 mg/dL (ref 6–23)
CO2: 27 mEq/L (ref 19–32)
Calcium: 9.7 mg/dL (ref 8.4–10.5)
Chloride: 106 mEq/L (ref 96–112)
Creatinine, Ser: 0.68 mg/dL (ref 0.4–1.2)
Creatinine, Ser: 0.73 mg/dL (ref 0.4–1.2)
GFR calc non Af Amer: >60 mL/min (ref >60)
Glucose, Bld: 109 mg/dL — ABNORMAL HIGH (ref 70–99)

## 2011-02-24 LAB — CULTURE, BLOOD (ROUTINE X 2)
Culture: NO GROWTH
Culture: NO GROWTH

## 2011-02-24 LAB — RAPID STREP SCREEN (MED CTR MEBANE ONLY): Streptococcus, Group A Screen (Direct): NEGATIVE

## 2011-02-24 LAB — URINE MICROSCOPIC-ADD ON

## 2011-02-24 LAB — POCT PREGNANCY, URINE: Preg Test, Ur: NEGATIVE

## 2011-04-02 NOTE — H&P (Signed)
NAMECADYN, RODGER            ACCOUNT NO.:  [id number]   MEDICAL RECORD NO.:  [id number]          PATIENT TYPE:  EMS   LOCATION:  MAJO                         FACILITY:  [facility]   PHYSICIAN:  Joylene John, MD       DATE OF BIRTH:  December 22, 1975   DATE OF ADMISSION:  06/07/2009  DATE OF DISCHARGE:                              HISTORY & PHYSICAL   The patient is being admitted to [facility], team G.   REASON FOR ADMISSION:  Rash since yesterday afternoon and fever early  this morning.   HISTORY OF PRESENT ILLNESS:  This is a 35 year old female without any  significant past medical history coming in with rash since yesterday and  fever this morning.  The patient tells me that she has been taking over-  the-counter pain medications, Aleve, Motrin, ibuprofen, a combination of  them up to 6-7 pills a day for her toothache for the last 3 weeks.  Denies any new medications.  No sick contacts.  No recent travels.  Denies any URI symptoms, so she did have some chills with fever the  today.  When the rash started, it was itchy.  The symptom has resolved  since she has received Benadryl.  She tells me that she woke up this  morning and her face was swollen.  No similar episodes in the past.   PAST MEDICAL HISTORY:  No significant past medical history.   FAMILY HISTORY:  Noncontributory.   ALLERGIES:  The patient tells me that as of now she did not know if she  has had any allergies to drugs or medicines.   SOCIAL HISTORY:  She is sexually active with boyfriend.  She has not  been tested for HIV in a while.  Smokes up to one and a half pack per  day since age 73.  Occasional beer.  No drugs.   PHYSICAL EXAMINATION:  VITAL SIGNS:  Temperature of 102.3, repeat was  101.2; blood pressure ranging from 115/72 to 108/67; pulse 92-96;  respirations 20-22; the patient is sating 96-97% on room air.  GENERAL:  The patient is in no acute distress, able to talk in  full  sentences.  HEENT:  She does have an infected tooth in the right upper part of her  mouth.  No icterus or pallor appreciated.  No JVD appreciated.  LUNGS:  Clear to auscultation posteriorly.  However, she does have  coarse breath sounds anteriorly.  CARDIOVASCULAR:  Regular rate and rhythm.  Belly is soft.  Bowel sounds  are present.  EXTREMITIES:  Lower extremities, no edema noted.  SKIN:  She does have maculopapular rash all over her body, more so on  her torso and her lower extremities.   Chest x-ray shows diffuse interstitial prominence with reticular nodular  opacities, which could be atypical infection.   LABORATORY DATA:  Labs show sodium was 135, potassium 4, chloride 100,  bicarb 26, BUN 8, creatinine 0.73, glucose 109, calcium 9.7.  UA is  clear.  Hemoglobin 13.9, crit 39.3, white count 5, platelet 186.  Rapid  strep test was  negative.  Eosinophils of note were within normal limits  at 4%.  Pregnancy test was negative.   ASSESSMENT AND PLAN:  A 35 year old female with fever, rash, and  pneumonia.  Plan is to admit the patient to the floor to a regular bed.  We will give antibiotics, ceftriaxone and azithromycin.  We will repeat  the chest x-ray in  the morning.  The patient has given permission to test for HIV.  If  there is no improvement with antibiotics and she continues to spike,  then may need to broaden antibiotic coverage and also work the patient  up for possible viral or autoimmune cause.      Joylene John, MD  Electronically Signed     RP/MEDQ  D:  06/07/2009  T:  06/08/2009  Job:  [id number]

## 2011-04-02 NOTE — Discharge Summary (Signed)
NAMEPRESCIOUS, HURLESS            ACCOUNT NO.:  [id number]   MEDICAL RECORD NO.:  [id number]          PATIENT TYPE:  INP   LOCATION:  5528                         FACILITY:  [facility]   PHYSICIAN:  Michelene Gardener, MD    DATE OF BIRTH:  26-Sep-1976   DATE OF ADMISSION:  06/07/2009  DATE OF DISCHARGE:  06/09/2009                               DISCHARGE SUMMARY   DISCHARGE DIAGNOSES:  1. Interstitial pneumonia.  2. Rash, most likely related to allergic reactions.  3. Leukopenia, resolved.   DISCHARGE MEDICATIONS:  1. Zithromax 500 mg once a day for 5 days.  2. Benadryl 25 mg q.6 h. as needed.   CONSULTATIONS:  ID consult.   PROCEDURES:  None.   DIAGNOSTIC STUDIES:  1. Chest x-ray on June 07, 2009 showed diffuse interstitial prominence      with reticular nodular density representing atypical pneumonia.  2. Repeat x-ray on June 08, 2009 showed atypical interstitial      pneumonia.  Follow up with [facility] within a week and      appointment was done for next Tuesday.   COURSE OF HOSPITALIZATION:  1. Pneumonia.  The patient was admitted to the hospital.  Was started      on Rocephin and Zithromax.  Initial x-ray was done on June 07, 2009      and results are mentioned above.  Repeat x-ray was done on June 08, 2009 and again results are mentioned above.  The patient improved      very quickly during this hospitalization.  ID recommended to switch      her medications to Zithromax p.o. to be taken for more 5 days.  2. Rash.  This is most likely secondary to allergic reaction.  This      patient recently moved and she stated that she has been around a      lot of surroundings and then she started developing her rash.  Her      rash improved with Benadryl and currently at the time of discharge,      she is almost back to her baseline.  I advised her to continue      taking Benadryl as needed.  3. Leukopenia, that was related to her underlying infection and the      matter of  fact, this improved with antibiotics.  The patient had      HIV testing which has been nonreactive.   Otherwise, other medical conditions remained stable.  The patient will  be discharged today in stable condition.   TOTAL ASSESSMENT TIME:  40 minutes.      Michelene Gardener, MD  Electronically Signed     NAE/MEDQ  D:  06/09/2009  T:  06/10/2009  Job:  [id number]

## 2011-04-05 NOTE — Discharge Summary (Signed)
[facility]  Patient:    Margaret Schneider, Margaret Schneider                   MRN: [id number] Adm. Date:  [id number] Disc. Date: [id number] Attending:  Michaelle Copas Dictator:   Marlinda Mike, CNM                           Discharge Summary  DATE OF BIRTH:                  06-Jun-1976  TIME OF ADMISSION:              1828, February 21, 2001, by Dr. Corky Sox  ADMISSION DIAGNOSIS:            Pelvic inflammatory disease with tuboovarian abscess versus ovarian cyst.  TIME OF DISCHARGE:              1700 on February 25, 2001, from teaching service per Dr. Clearance Coots.  DISCHARGE DIAGNOSIS:            1. Gonococcal pelvic inflammatory disease.                                 2. Tuboovarian abscess.  PRESENTING COMPLAINT:           Increasing lower abdominal pain x 5 days.  HISTORY OF PRESENT ILLNESS:     Presented at [facility] Emergency Department with evaluation by Dr. Ignacia Palma.  The patient is a 35 year old G6, P4-0-2-4 with last menstrual period of February 02, 2001.  On admission, temperature 99.1, pulse 66, respirations 20, blood pressure 105/69.  No significant medical history.  No history of any sexually transmitted disease.  History of childbirth x 4 with spontaneous vaginal deliveries.  Negative urine pregnancy test.  CBC completed. White blood cell count 12.9. Wet prep revealed many white blood cells, many clue cells.  C-MET within normal limits.  Amylase was normal.  Lipase was normal.  Ultrasound revealed left ovarian cyst, approximately 3 cm consistent with a TOA.  Physical exam revealed left lower quadrant pain on exam with profuse cervical and vaginal discharge.  Gynecologic M.D. on call, Dr. Rana Snare, concerning for transfer to [facility] per the service of teaching service.  HOSPITAL COURSE:                The patient received triple antibiotics.  She received clindamycin 900 mg IV q.8h., ampicillin 2 g IV q.6h. and gentamicin IV per protocol  x 4-1/2 days during hospital admission.  The patient was afebrile during the entire admission.  On day 4-1/2, IV infiltrated.  The patient declined further IV antibiotics.  Discussion with Dr. Clearance Coots.  Patient to start p.o. antibiotics and be discharged to home.  STATUS:                         Condition at discharge stable.   Vital signs stable, afebrile.  Minimal abdominal tenderness.  Pain resolved, relieved with Motrin p.o.  LABORATORY DATA:                Cultures returned during hospitalization, gonorrhea culture positive, Chlamydia culture negative.  DISPOSITION:                    The patient is to be discharged to home with family.  DISCHARGE MEDICATIONS:          1. Prescription for doxycycline 100 mg p.o                                    b.i.d. x 14 days.                                 2. Flagyl 500 mg p.o. b.i.d. x 14 days.                                 3. Ibuprofen 600 mg p.o. q.6h. p.r.n. x 3                                    days.                                 4. May use over-the-counter Motrin or Tylenol                                    as needed after day #3.                                 5. Ferrous sulfate 325 mg p.o. b.i.d. to take                                    with meals as an iron supplement for                                    anemia.  DISCHARGE INSTRUCTIONS:         No sexual intercourse x 2 weeks.  To return to [facility] with increased temperature greater than 100, increasing abdominal pain or any other symptoms.  Patient to complete all antibiotics as prescribed.  FOLLOW-UP:                      Follow up will be scheduled at the GYN Clinic in two weeks.  Patient to call the GYN Clinic tomorrow. DD:  02/26/01 TD:  02/26/01 Job: [id number]

## 2011-04-05 NOTE — Discharge Summary (Signed)
   [name], Margaret Schneider                      ACCOUNT NO.:  [id number]   MEDICAL RECORD NO.:  [id number]                   PATIENT TYPE:  INP   LOCATION:  9128                                 [facility]ILITY[facility]  [facility]   PHYSICIAN:  [name], D.O.                   DATE OF BIRTH:  28-Jul-1976   DATE OF ADMISSION:  10/13/2002  DATE OF DISCHARGE:  10/15/2002                                 DISCHARGE SUMMARY   DATE OF BIRTH:  [birth year].   Per dictating physician, please disregard dictation as the patient stayed  less than 48 hours.                                               [name], D.O.    KL/MEDQ  D:  10/15/2002  T:  10/16/2002  Job:  [id number]   cc:   Michele Mcalpine D. Okey Dupre, M.D.  [address]  [place]  [place] [address]  Fax: [phone]

## 2011-04-05 NOTE — Discharge Summary (Signed)
   NAME:  Margaret Schneider, Margaret Schneider                      ACCOUNT NO.:  [id number]   MEDICAL RECORD NO.:  [id number]                   PATIENT TYPE:  INP   LOCATION:  9128                                 [facility]ILITY:  [facility]   PHYSICIAN:  Phil D. Okey Dupre, M.D.                  DATE OF BIRTH:  Feb 06, 1976   DATE OF ADMISSION:  10/13/2002  DATE OF DISCHARGE:  10/15/2002                                 DISCHARGE SUMMARY   DISCHARGE MEDICATIONS:  1. Motrin 600 mg one p.o. q.6h. p.r.n.  2. Prenatal vitamins one p.o. daily for six weeks.   FOLLOWUP INSTRUCTIONS:  Return to GYN clinic within four weeks.   HOSPITAL COURSE:  A 35 year old G8, P2-2-2-4 at 40 weeks presented to  maternal admission with spontaneous rupture of membranes of clear fluid with  spontaneous onset of labor.  Reason for admission was preterm labor.  The  patient poorly dated due to no prenatal care.  Suspected to be at 20 weeks  and 4 days with precipitous delivery of fetus.  Pitocin was started;  however, patient was completely dilated.  The patient admitted to smoking  and occasional alcohol use.  Denied any current drug use.  The patient  delivered a premature female infant with Apgars of 4 at one minute and 7 at  five minutes.  NICU team was there to intubate newborn baby.  Placenta had  three vessel cord with pH of 7.40.  Placenta was sent for pathology and  turned out normal.  Baby sent to the NICU.  Mother discharged home without  complications on postpartum day number two after receiving ampicillin  postpartum.     [name], D.O.                         Phil D. Okey Dupre, M.D.    [name]  D:  12/01/2002  T:  12/01/2002  Job:  [id number]

## 2011-04-05 NOTE — Op Note (Signed)
NAMEJANEAL, ABADI            ACCOUNT NO.:  [id number]   MEDICAL RECORD NO.:  [id number]          PATIENT TYPE:  INP   LOCATION:  9122                          [facility]ACILITY:  [facility]   PHYSICIAN:  Conni Elliot, M.D.DATE OF BIRTH:  May 14, 1976   DATE OF PROCEDURE:  04/16/2005  DATE OF DISCHARGE:                                 OPERATIVE REPORT   PREOPERATIVE DIAGNOSES:  Desire surgical sterilization.   POSTOPERATIVE DIAGNOSES:  Desire surgical sterilization.   OPERATION:  Modified bilateral Pomeroy tubal ligation.   SURGEON:  Conni Elliot, M.D.   ANESTHESIA:  Failed epidural to general anesthesia.   DESCRIPTION OF PROCEDURE:  After placing the patient under general  orotracheal anesthesia with the patient supine, the abdomen was prepped and  draped in a sterile fashion. A periumbilical incision was made, through the  subcutaneous tissue and fascia, peritoneal cavity entered. The right  fallopian tube was identified, grasped with Babcock clamps, followed to the  fimbriated end. A segment of tube was brought onto the operative field,  double suture ligated, approximately 2 cm segment was excised, hemostasis  adequate. A similar procedure was done on the opposite side, hemostasis  again adequate. The anterior peritoneum, fascia, subcutaneous skin were  closed in an adequate fashion. Estimated blood loss was less than 10 mL.      ASG/MEDQ  D:  04/16/2005  T:  04/16/2005  Job:  [id number]
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
## 2011-04-05 NOTE — Discharge Summary (Signed)
Margaret Schneider, Margaret Schneider            ACCOUNT NO.:  [id number]   MEDICAL RECORD NO.:  [id number]          PATIENT TYPE:  INP   LOCATION:  9159                          FACILITY:  [facility]   PHYSICIAN:  Conni Elliot, M.D.DATE OF BIRTH:  08-Nov-1976   DATE OF ADMISSION:  01/31/2005  DATE OF DISCHARGE:                                 DISCHARGE SUMMARY   ADMISSION DIAGNOSES:  1.  Intrauterine pregnancy at 24 weeks 5 days with dynamic cervix.  2.  History of cocaine use.  3.  Dental pain/decay.   DISCHARGE DIAGNOSES:  1.  Intrauterine pregnancy at 24 weeks 6 days with no cervical change and      fetal fibronectin negative.  2.  Urine drug screen negative for cocaine.  3.  Dental pain/decay.  4.  Positive chlamydia.   DISCHARGE MEDICATIONS:  1.  Procardia XL 30 mg one p.o. b.i.d.  2.  Percocet 5/325 one by mouth q.4h. p.r.n. tooth pain.  3.  Clindamycin 300 mg p.o. q.i.d. x1 week.  4.  Prenatal vitamins one by mouth per day.   HISTORY OF PRESENT ILLNESS:  The patient is a 35 year old G10 P2-3-4-5 who  presents at 24 weeks 5 days dated by a last menstrual period. She was sent  from the high risk clinic for admission due to ultrasound showing a dynamic  cervix. She had no contractions, no vaginal bleeding, and positive fetal  activity. Physical exam was notable for temperature 98.5, blood pressure  102/55. Exam was notable for Nitrazine negative, fern negative, with a  digital cervix exam fingertip, thick, and -2. Fetal heart monitoring was  baseline 150s with variability. The patient was admitted to the antenatal  service for continuous monitoring, betamethasone, and observation.   HOSPITAL COURSE:  #1- INTRAUTERINE PREGNANCY AT 24 WEEKS 5 DAYS. The patient  was admitted and monitored. The patient had no cervical change at time of  discharge. The patient's fetal fibronectin was negative. The patient was  medicated while in the hospital with Procardia and betamethasone. At time of  discharge the patient was not contracting and stable.   #2 - HISTORY OF COCAINE USE. The patient has a history of cocaine use but  denied recent cocaine use, especially during this pregnancy. UDS was  negative with the exception of opiates which were related to recent Tylenol  No. 3 use, see problem #3.   #3 - The patient has a history of dental pain/decay. She had recently had  several teeth pulled and for this she had been taking Tylenol No. 3. The  patient had continued pain. The patient was started on clindamycin due to  the likelihood of anaerobic infection and dental caries. The patient was  discharged on clindamycin for 1 week with p.r.n. Percocet with instructions  to follow up with her dentist.   #4 - POSITIVE CHLAMYDIA. The patient had GC and chlamydia completed during  this hospitalization. Chlamydia was positive. The patient was treated with 1  g of azithromycin p.o. x1 with 125 mg of Rocephin IM x1 for gonorrhea. Note,  the patient was not positive for gonorrhea on the [facility]  probe. The patient  tolerated these medications without complication.   #5 - GBS was completed during this hospitalization and at the time of  discharge is pending.   CONDITION ON DISCHARGE:  The patient was not contracting and stable for  discharge with follow-up at the high risk clinic.   DISCHARGE INSTRUCTIONS:  Routine preterm labor instructions with  instructions for the medications listed above and follow-up at the high risk  clinic.      GSD/MEDQ  D:  02/01/2005  T:  02/01/2005  Job:  [id number]

## 2011-10-11 ENCOUNTER — Ambulatory Visit: Payer: Self-pay | Admitting: Pulmonary Disease

## 2011-11-08 ENCOUNTER — Ambulatory Visit: Payer: Self-pay | Admitting: Pulmonary Disease

## 2011-12-04 ENCOUNTER — Ambulatory Visit (INDEPENDENT_AMBULATORY_CARE_PROVIDER_SITE_OTHER): Payer: Self-pay | Admitting: Pulmonary Disease

## 2011-12-04 ENCOUNTER — Ambulatory Visit (INDEPENDENT_AMBULATORY_CARE_PROVIDER_SITE_OTHER)
Admission: RE | Admit: 2011-12-04 | Discharge: 2011-12-04 | Disposition: A | Discharge status: Home / Self Care | Payer: Self-pay | Source: Ambulatory Visit | Attending: Pulmonary Disease | Admitting: Pulmonary Disease

## 2011-12-04 ENCOUNTER — Encounter: Payer: Self-pay | Admitting: Pulmonary Disease

## 2011-12-04 VITALS — BP 110/80 | HR 79 | Temp 98.5°F | Ht 66.0 in | Wt 153.0 lb

## 2011-12-04 DIAGNOSIS — D869 Sarcoidosis, unspecified: Secondary | ICD-10-CM

## 2011-12-04 MED ORDER — PREDNISONE 5 MG PO TABS: 5.0000 mg | ORAL_TABLET | Freq: Every day | ORAL | Status: AC

## 2011-12-04 NOTE — Patient Instructions (Signed)
Chest xray today Take prednisone 10 mg daily x 2 weeks , then 5 mg daily

## 2011-12-04 NOTE — Assessment & Plan Note (Addendum)
Chest xray today Take prednisone 10 mg daily x 2 weeks , then 5 mg daily Can taper to off over next 2-3 months Reassess Spirometry at some point.

## 2011-12-04 NOTE — Progress Notes (Signed)
  Subjective:    Patient ID: Margaret Schneider, female    DOB: [birth year], 36 y.o.   MRN: [id number]  HPI 35/F, smoker Hx of polysubstance abuse for FU of sarcoidosis  Admitted 04/29/10 for increased dyspnea. CXR and CT showed interstitial lung dz. TBBXshowed several non caseating granulomata associated w/ mulitnucleated giant cells.  ACE level was 212. She does continue to smoke. and drink alcohol daily. She denies any cocaine use .  Hospital workup showed neg HIV, Hepatitis panel neg, Mild LFTs increase. PFT showed FEV1 at 1.64 (56%), positive BD response. ratio of 81. DLCO 43  Has been on prednisone intermittently for 'flares'   12/04/2011 1 yr FU Stopped pred 5/12 Now tired, dyspneic, smoked 2 cigs/d, gained 15 lbs / yr, denies cocaine use CXR unchanged interstitial opacities No skin lesions  Review of Systems Patient denies significant dyspnea,cough, hemoptysis,  chest pain, palpitations, pedal edema, orthopnea, paroxysmal nocturnal dyspnea, lightheadedness, nausea, vomiting, abdominal or  leg pains      Objective:   Physical Exam  Gen. Pleasant, well-nourished, in no distress ENT - no lesions, no post nasal drip Neck: No JVD, no thyromegaly, no carotid bruits Lungs: no use of accessory muscles, no dullness to percussion, clear without rales or rhonchi  Cardiovascular: Rhythm regular, heart sounds  normal, no murmurs or gallops, no peripheral edema Musculoskeletal: No deformities, no cyanosis or clubbing        Assessment & Plan:

## 2012-01-15 ENCOUNTER — Ambulatory Visit: Payer: Self-pay | Admitting: Adult Health

## 2013-02-09 ENCOUNTER — Ambulatory Visit: Payer: Self-pay | Admitting: Adult Health

## 2013-02-12 ENCOUNTER — Encounter: Payer: Self-pay | Admitting: Adult Health

## 2013-02-12 ENCOUNTER — Ambulatory Visit (INDEPENDENT_AMBULATORY_CARE_PROVIDER_SITE_OTHER)
Admission: RE | Admit: 2013-02-12 | Discharge: 2013-02-12 | Disposition: A | Discharge status: Home / Self Care | Payer: Self-pay | Source: Ambulatory Visit | Attending: Adult Health | Admitting: Adult Health

## 2013-02-12 ENCOUNTER — Ambulatory Visit (INDEPENDENT_AMBULATORY_CARE_PROVIDER_SITE_OTHER): Payer: Self-pay | Admitting: Adult Health

## 2013-02-12 VITALS — BP 122/82 | HR 75 | Temp 97.1°F | Ht 66.0 in | Wt 156.2 lb

## 2013-02-12 DIAGNOSIS — D869 Sarcoidosis, unspecified: Secondary | ICD-10-CM

## 2013-02-12 MED ORDER — PREDNISONE 10 MG PO TABS: ORAL_TABLET | ORAL | Status: DC

## 2013-02-12 NOTE — Patient Instructions (Addendum)
Chest xray today Restart prednisone 20mg  daily for 1 week then 10mg  daily for 1 week then back to 5mg  daily until seen back in office  follow up Dr. Vassie Loll  In 4 weeks and As needed   Please contact office for sooner follow up if symptoms do not improve or worsen or seek emergency care

## 2013-02-12 NOTE — Progress Notes (Signed)
  Subjective:    Patient ID: Margaret Schneider, female    DOB: [birth year], 37 y.o.   MRN: [id number]  HPI  35/F, smoker Hx of polysubstance abuse for FU of sarcoidosis  Admitted 04/29/10 for increased dyspnea. CXR and CT showed interstitial lung dz. TBBXshowed several non caseating granulomata associated w/ mulitnucleated giant cells.  ACE level was 212. She does continue to smoke. and drink alcohol daily. She denies any cocaine use .  Hospital workup showed neg HIV, Hepatitis panel neg, Mild LFTs increase. PFT showed FEV1 at 1.64 (56%), positive BD response. ratio of 81. DLCO 43  Has been on prednisone intermittently for 'flares'   12/04/11 1 yr FU Stopped pred 5/12 Now tired, dyspneic, smoked 2 cigs/d, gained 15 lbs / yr, denies cocaine use CXR unchanged interstitial opacities No skin lesions >>decrease pred 5mg    02/12/2013 Acute OV  Complains increased SOB, wheezing, tightness in chest, cough with occ white mucus production over last 4 weeks.  Continues to smoke.  No drug use in 6 months.  Ran out of prednisone ~1 month ago.  No discolored mucus or fever.  No chest pain or abd pain . No rash.    Review of Systems  Patient denies  hemoptysis,  chest pain, palpitations, pedal edema, orthopnea, paroxysmal nocturnal dyspnea, lightheadedness, nausea, vomiting, abdominal or  leg pains      Objective:   Physical Exam   Gen. Pleasant, well-nourished, in no distress ENT - no lesions, no post nasal drip Neck: No JVD, no thyromegaly, no carotid bruits Lungs: no use of accessory muscles, no dullness to percussion, clear without rales or rhonchi  Cardiovascular: Rhythm regular, heart sounds  normal, no murmurs or gallops, no peripheral edema Musculoskeletal: No deformities, no cyanosis or clubbing        Assessment & Plan:

## 2013-02-12 NOTE — Assessment & Plan Note (Signed)
Flare   Plan  Chest xray today Restart prednisone 20mg  daily for 1 week then 10mg  daily for 1 week then back to 5mg  daily until seen back in office  follow up Dr. Vassie Loll  In 4 weeks and As needed   Please contact office for sooner follow up if symptoms do not improve or worsen or seek emergency care

## 2013-02-17 NOTE — Progress Notes (Signed)
Quick Note:  LMOM TCB x1. ______ 

## 2013-02-19 NOTE — Progress Notes (Signed)
Quick Note:  LMOM TCB x1 on 02-17-13. ______ 

## 2013-03-25 ENCOUNTER — Ambulatory Visit: Payer: Self-pay | Admitting: Pulmonary Disease

## 2013-06-23 ENCOUNTER — Encounter (HOSPITAL_COMMUNITY): Payer: Self-pay | Admitting: Emergency Medicine

## 2013-06-23 ENCOUNTER — Emergency Department (HOSPITAL_COMMUNITY)
Admission: EM | Admit: 2013-06-23 | Discharge: 2013-06-23 | Disposition: A | Discharge status: Home / Self Care | Payer: Self-pay | Attending: Emergency Medicine | Admitting: Emergency Medicine

## 2013-06-23 ENCOUNTER — Emergency Department (HOSPITAL_COMMUNITY): Payer: Self-pay

## 2013-06-23 DIAGNOSIS — R0602 Shortness of breath: Secondary | ICD-10-CM | POA: Insufficient documentation

## 2013-06-23 DIAGNOSIS — Z8719 Personal history of other diseases of the digestive system: Secondary | ICD-10-CM | POA: Insufficient documentation

## 2013-06-23 DIAGNOSIS — F17200 Nicotine dependence, unspecified, uncomplicated: Secondary | ICD-10-CM | POA: Insufficient documentation

## 2013-06-23 DIAGNOSIS — Z8742 Personal history of other diseases of the female genital tract: Secondary | ICD-10-CM | POA: Insufficient documentation

## 2013-06-23 DIAGNOSIS — D869 Sarcoidosis, unspecified: Secondary | ICD-10-CM | POA: Insufficient documentation

## 2013-06-23 DIAGNOSIS — R21 Rash and other nonspecific skin eruption: Secondary | ICD-10-CM | POA: Insufficient documentation

## 2013-06-23 DIAGNOSIS — R05 Cough: Secondary | ICD-10-CM | POA: Insufficient documentation

## 2013-06-23 DIAGNOSIS — R059 Cough, unspecified: Secondary | ICD-10-CM | POA: Insufficient documentation

## 2013-06-23 DIAGNOSIS — R109 Unspecified abdominal pain: Secondary | ICD-10-CM | POA: Insufficient documentation

## 2013-06-23 DIAGNOSIS — Z79899 Other long term (current) drug therapy: Secondary | ICD-10-CM | POA: Insufficient documentation

## 2013-06-23 DIAGNOSIS — L299 Pruritus, unspecified: Secondary | ICD-10-CM | POA: Insufficient documentation

## 2013-06-23 LAB — BASIC METABOLIC PANEL
BUN: 9 mg/dL (ref 6–23)
Calcium: 9.2 mg/dL (ref 8.4–10.5)
Chloride: 100 mEq/L (ref 96–112)
Creatinine, Ser: 0.56 mg/dL (ref 0.50–1.10)
GFR calc Af Amer: >90 mL/min (ref >90)

## 2013-06-23 LAB — CBC
HCT: 37.2 % (ref 36.0–46.0)
MCH: 36.1 pg — ABNORMAL HIGH (ref 26.0–34.0)
MCHC: 36.3 g/dL — ABNORMAL HIGH (ref 30.0–36.0)
MCV: 99.5 fL (ref 78.0–100.0)
RDW: 11.9 % (ref 11.5–15.5)
WBC: 4.8 10*3/uL (ref 4.0–10.5)

## 2013-06-23 MED ORDER — PREDNISONE 10 MG PO TABS: ORAL_TABLET | ORAL | Status: DC

## 2013-06-23 MED ORDER — PREDNISONE 20 MG PO TABS
60.0000 mg | ORAL_TABLET | Freq: Once | ORAL | Status: AC
  Administered 2013-06-23: 60 mg via ORAL
  Filled 2013-06-23: qty 3

## 2013-06-23 MED ORDER — ALBUTEROL SULFATE HFA 108 (90 BASE) MCG/ACT IN AERS
6.0000 | INHALATION_SPRAY | Freq: Once | RESPIRATORY_TRACT | Status: AC
  Administered 2013-06-23: 6 via RESPIRATORY_TRACT
  Filled 2013-06-23: qty 6.7

## 2013-06-23 MED ORDER — HYDROXYZINE HCL 10 MG PO TABS: 10.0000 mg | ORAL_TABLET | Freq: Three times a day (TID) | ORAL | Status: DC | PRN

## 2013-06-23 NOTE — ED Notes (Signed)
Tatyana PA at bedside. No one else in pt's family has any rash.

## 2013-06-23 NOTE — ED Notes (Signed)
Pt c/o SOB and possible allergic reaction; pt sts started 4 days ago and worse today; pt sts hx of sarcoidosis and sts increased pred several weeks ago; pt tachyonic at present

## 2013-06-23 NOTE — ED Provider Notes (Signed)
CSN: [id number]     Arrival date & time 06/23/13  1416 History     First MD Initiated Contact with Patient 06/23/13 1507     Chief Complaint  Patient presents with  . Shortness of Breath  . Allergic Reaction   (Consider location/radiation/quality/duration/timing/severity/associated sxs/prior Treatment) HPI Margaret Schneider is a 37 y.o. female who presents to ED with complaint of rash and shortness of breath. States hx of sarcoidosis. Stats rash began 4 days ago, rash is diffuse. No one in household has sam rash. Rash is very itchy. Has not tried any medications for it.states also developed SOB, mild cough, and bilateral flank "tightness." states currently takes 10mg  of prednisone daily for her sarcoidosis. No fever, chills, urinary symptoms, malaise. No new detergents or products. No new medications.    Past Medical History  Diagnosis Date  . Sarcoidosis   . Dyspnea   . Abdominal pain, generalized   . STD (sexually transmitted disease) complicating pregnancy, delivered   . Decreased visual acuity   . Dental caries   . Routine gynecological examination   . Tobacco abuse   . Gastritis    Past Surgical History  Procedure Laterality Date  . Tubal ligation     Family History  Problem Relation Age of Onset  . Asthma Maternal Aunt     x 2  . Rheum arthritis Maternal Grandfather   . Cancer    . Breast cancer Maternal Aunt     x 2  . Diabetes Maternal Grandfather   . Diabetes Cousin    History  Substance Use Topics  . Smoking status: Current Every Day Smoker -- 0.20 packs/day for 21 years    Types: Cigarettes  . Smokeless tobacco: Never Used     Comment: started smoking at age 39  . Alcohol Use: Yes     Comment: 1 40oz beer three times weekly   OB History   Grav Para Term Preterm Abortions TAB SAB Ect Mult Living                 Review of Systems  Constitutional: Negative for fever and chills.  HENT: Negative for neck pain and neck stiffness.   Respiratory:  Positive for shortness of breath. Negative for cough, chest tightness and wheezing.   Cardiovascular: Negative for leg swelling.  Gastrointestinal: Negative.   Genitourinary: Negative.   Musculoskeletal: Negative.   Skin: Positive for rash.  Neurological: Negative for dizziness, light-headedness and headaches.  All other systems reviewed and are negative.    Allergies  Review of patient's allergies indicates no known allergies.  Home Medications   Current Outpatient Rx  Name  Route  Sig  Dispense  Refill  . albuterol (VENTOLIN HFA) 108 (90 BASE) MCG/ACT inhaler   Inhalation   Inhale 2 puffs into the lungs every 6 (six) hours as needed.           . Multiple Vitamin (MULTIVITAMIN) tablet   Oral   Take 1 tablet by mouth daily.         . predniSONE (DELTASONE) 10 MG tablet      2 tabs daily x 1 week, 1 tab daily for 1 week, and then 1/2 daily   60 tablet   3    BP 127/91  Pulse 70  Temp(Src) 98.2 F (36.8 C) (Oral)  Resp 18  SpO2 97%  LMP 06/15/2013 Physical Exam  Nursing note and vitals reviewed. Constitutional: She is oriented to person, place, and time. She appears  well-developed and well-nourished. No distress.  HENT:  Head: Normocephalic and atraumatic.  Mouth/Throat: Oropharynx is clear and moist.  No oral mucosal rash  Eyes: Conjunctivae and EOM are normal. Pupils are equal, round, and reactive to light.  Neck: Neck supple.  Cardiovascular: Normal rate, regular rhythm and normal heart sounds.   Pulmonary/Chest: Effort normal and breath sounds normal. No respiratory distress. She has no wheezes. She has no rales.  Abdominal: Soft. Bowel sounds are normal. She exhibits no distension. There is no tenderness. There is no rebound.  Musculoskeletal: She exhibits no edema.  Neurological: She is alert and oriented to person, place, and time.  Skin: Skin is warm and dry.    ED Course   Procedures (including critical care time)   Date: 06/23/2013  Rate: 82   Rhythm: normal sinus rhythm  QRS Axis: normal  Intervals: QT prolonged  ST/T Wave abnormalities: nonspecific T wave changes  Conduction Disutrbances:none  Narrative Interpretation:   Old EKG Reviewed: none available  Results for orders placed during the hospital encounter of 06/23/13  CBC      Result Value Range   WBC 4.8  4.0 - 10.5 K/uL   RBC 3.74 (*) 3.87 - 5.11 MIL/uL   Hemoglobin 13.5  12.0 - 15.0 g/dL   HCT 78.2  95.6 - 21.3 %   MCV 99.5  78.0 - 100.0 fL   MCH 36.1 (*) 26.0 - 34.0 pg   MCHC 36.3 (*) 30.0 - 36.0 g/dL   RDW 08.6  57.8 - 46.9 %   Platelets 155  150 - 400 K/uL  BASIC METABOLIC PANEL      Result Value Range   Sodium 135  135 - 145 mEq/L   Potassium 4.0  3.5 - 5.1 mEq/L   Chloride 100  96 - 112 mEq/L   CO2 24  19 - 32 mEq/L   Glucose, Bld 80  70 - 99 mg/dL   BUN 9  6 - 23 mg/dL   Creatinine, Ser 6.29  0.50 - 1.10 mg/dL   Calcium 9.2  8.4 - 52.8 mg/dL   GFR calc non Af Amer >90  >90 mL/min   GFR calc Af Amer >90  >90 mL/min  PRO B NATRIURETIC PEPTIDE      Result Value Range   Pro B Natriuretic peptide (BNP) 65.7  0 - 125 pg/mL  POCT I-STAT TROPONIN I      Result Value Range   Troponin i, poc 0.01  0.00 - 0.08 ng/mL   Comment 3            Dg Chest 2 View  06/23/2013   *RADIOLOGY REPORT*  Clinical Data: Shortness of breath, allergic reaction  CHEST - 2 VIEW  Comparison: 02/12/2013  Findings: Lungs are essentially clear.  No focal consolidation. No pleural effusion or pneumothorax.  The heart is normal in size.  Mild lower thoracic dextroscoliosis.  IMPRESSION: No evidence of acute cardiopulmonary disease.   Original Report Authenticated By: Charline Bills, M.D.     1. Shortness of breath   2. Rash       MDM  Pt with diffuse rash over entire body, shortness of breath. No sgins of anaphylaxis. Symptoms for 4 days. No oral mucosal involvement, no signs of steven johnsons. Lungs are clear. Rash is improving, doubt scabies, doubt drug reaction. Will  increase prednisone dose for 5 day taper down to regular 10mg /day. Inhaler provided. Vistaril for itching. Results as above. PERC negative. Doubt ACS, symptoms are  atypical VS are normal. Pt agrees with plan of home with medications and close follow up.   Filed Vitals:   06/23/13 1427  BP: 127/91  Pulse: 70  Temp: 98.2 F (36.8 C)  TempSrc: Oral  Resp: 18  SpO2: 97%       Soniya Ashraf A Gustavus Haskin, PA-C 06/23/13 1755

## 2013-06-23 NOTE — ED Notes (Signed)
Pt developed a rash last Friday on her arms, legs, and back. Pt states she also began to have some sob. Pt states she has sarcoidosis.

## 2013-06-24 NOTE — ED Provider Notes (Signed)
Medical screening examination/treatment/procedure(s) were conducted as a shared visit with non-physician practitioner(s) and myself.  I personally evaluated the patient during the encounter  Pt seen and evaluated. Rash possibly flea bites, no known tick exposures, and rash resolving. Pt in no acute distress, AFVSS here, no inc wob noted on exam.   Junius Argyle, MD 06/24/13 1000

## 2014-08-09 ENCOUNTER — Ambulatory Visit (INDEPENDENT_AMBULATORY_CARE_PROVIDER_SITE_OTHER): Payer: Self-pay | Admitting: Adult Health

## 2014-08-09 ENCOUNTER — Encounter: Payer: Self-pay | Admitting: Adult Health

## 2014-08-09 VITALS — BP 118/84 | HR 80 | Ht 66.0 in | Wt 133.4 lb

## 2014-08-09 DIAGNOSIS — D869 Sarcoidosis, unspecified: Secondary | ICD-10-CM

## 2014-08-09 MED ORDER — PREDNISONE 5 MG PO TABS: ORAL_TABLET | ORAL | Status: DC

## 2014-08-09 NOTE — Progress Notes (Signed)
  Subjective:    Patient ID: Margaret Schneider, female    DOB: [birth year], 38 y.o.   MRN: [id number]  HPI  35/F, smoker Hx of polysubstance abuse for FU of sarcoidosis  Admitted 04/29/10 for increased dyspnea. CXR and CT showed interstitial lung dz. TBBXshowed several non caseating granulomata associated w/ mulitnucleated giant cells.  ACE level was 212. She does continue to smoke. and drink alcohol daily. She denies any cocaine use .  Hospital workup showed neg HIV, Hepatitis panel neg, Mild LFTs increase. PFT showed FEV1 at 1.64 (56%), positive BD response. ratio of 81. DLCO 43  Has been on prednisone intermittently for 'flares'   12/04/11 1 yr FU Stopped pred 5/12 Now tired, dyspneic, smoked 2 cigs/d, gained 15 lbs / yr, denies cocaine use CXR unchanged interstitial opacities No skin lesions >>decrease pred 5mg    3/28/145  Acute OV  Complains increased SOB, wheezing, tightness in chest, cough with occ white mucus production over last 4 weeks.  Continues to smoke.  No drug use in 6 months.  Ran out of prednisone ~1 month ago.  No discolored mucus or fever.  No chest pain or abd pain . No rash.  >>  08/09/2014 Acute OV  Complains increase in SOB, dry cough and chest tightness with activity over last year. Worse for last month. She did not follow up as directed from last ov.   Continues to smoke , advised on smoking cessation.  Denies cocaine use.  Ran out of prednisone for 2 months .  Last cxr 06/2014 w/ no acute process .  Denies rash, fever, visual problems. Wears glassess.  No discolored mucus.  Last PFT was 2011 showed FEV1 at 1.64 (56%), positive BD response. ratio of 81. DLCO 43    Review of Systems Constitutional:   No  weight loss, night sweats,  Fevers, chills, + fatigue, or  lassitude.  HEENT:   No headaches,  Difficulty swallowing,  Tooth/dental problems, or  Sore throat,                No sneezing, itching, ear ache, + nasal congestion, post nasal drip,   CV:   No chest pain,  Orthopnea, PND, swelling in lower extremities, anasarca, dizziness, palpitations, syncope.   GI  No heartburn, indigestion, abdominal pain, nausea, vomiting, diarrhea, change in bowel habits, loss of appetite, bloody stools.   Resp:  .  No chest wall deformity  Skin: no rash or lesions.  GU: no dysuria, change in color of urine, no urgency or frequency.  No flank pain, no hematuria   MS:  No joint pain or swelling.  No decreased range of motion.  No back pain.  Psych:  No change in mood or affect. No depression or anxiety.  No memory loss.          Objective:   Physical Exam   Gen. Pleasant, well-nourished, in no distress ENT - no lesions, no post nasal drip Neck: No JVD, no thyromegaly, no carotid bruits Lungs: no use of accessory muscles, no dullness to percussion, clear without rales or rhonchi  Cardiovascular: Rhythm regular, heart sounds  normal, no murmurs or gallops, no peripheral edema Musculoskeletal: No deformities, no cyanosis or clubbing        Assessment & Plan:

## 2014-08-09 NOTE — Patient Instructions (Signed)
Restart Prednisone 10mg  daily for 2 weeks then 5mg  daily until seen back in office  Labs and chest xray today  Follow up Dr. Elsworth Soho  In 4-6 weeks with PFT  Please contact office for sooner follow up if symptoms do not improve or worsen or seek emergency care  Very important to keep your follow up appointments.

## 2014-08-09 NOTE — Addendum Note (Signed)
Addended by: Parke Poisson E on: 08/09/2014 04:45 PM   Modules accepted: Orders

## 2014-08-09 NOTE — Assessment & Plan Note (Signed)
Flare  Steroid education  given to pt w/ risks and benefits  and importance of follow up  Need follow up PFT  cxr and ACE level today   Plan  Restart Prednisone 10mg  daily for 2 weeks then 5mg  daily until seen back in office  Labs and chest xray today  Follow up Dr. Elsworth Soho  In 4-6 weeks with PFT  Please contact office for sooner follow up if symptoms do not improve or worsen or seek emergency care  Very important to keep your follow up appointments.

## 2014-08-11 NOTE — Progress Notes (Signed)
Reviewed & agree with plan  

## 2014-10-03 ENCOUNTER — Other Ambulatory Visit: Payer: Self-pay | Admitting: Pulmonary Disease

## 2014-10-03 ENCOUNTER — Ambulatory Visit: Payer: Self-pay | Admitting: Pulmonary Disease

## 2014-10-03 DIAGNOSIS — D869 Sarcoidosis, unspecified: Secondary | ICD-10-CM

## 2015-01-11 ENCOUNTER — Telehealth: Payer: Self-pay | Admitting: Pulmonary Disease

## 2015-01-11 NOTE — Telephone Encounter (Signed)
error 

## 2015-01-17 ENCOUNTER — Ambulatory Visit (INDEPENDENT_AMBULATORY_CARE_PROVIDER_SITE_OTHER)
Admission: RE | Admit: 2015-01-17 | Discharge: 2015-01-17 | Disposition: A | Discharge status: Home / Self Care | Payer: Self-pay | Source: Ambulatory Visit | Attending: Adult Health | Admitting: Adult Health

## 2015-01-17 ENCOUNTER — Ambulatory Visit (INDEPENDENT_AMBULATORY_CARE_PROVIDER_SITE_OTHER): Payer: Self-pay | Admitting: Adult Health

## 2015-01-17 ENCOUNTER — Encounter: Payer: Self-pay | Admitting: Adult Health

## 2015-01-17 VITALS — BP 120/84 | HR 85 | Temp 98.5°F | Ht 66.0 in | Wt 136.0 lb

## 2015-01-17 DIAGNOSIS — D869 Sarcoidosis, unspecified: Secondary | ICD-10-CM

## 2015-01-17 MED ORDER — PREDNISONE 10 MG PO TABS: ORAL_TABLET | ORAL | Status: DC

## 2015-01-17 MED ORDER — BUDESONIDE-FORMOTEROL FUMARATE 160-4.5 MCG/ACT IN AERO: 2.0000 | INHALATION_SPRAY | Freq: Two times a day (BID) | RESPIRATORY_TRACT | Status: AC

## 2015-01-17 MED ORDER — ALBUTEROL SULFATE HFA 108 (90 BASE) MCG/ACT IN AERS: 2.0000 | INHALATION_SPRAY | Freq: Four times a day (QID) | RESPIRATORY_TRACT | Status: AC | PRN

## 2015-01-17 NOTE — Progress Notes (Signed)
  Subjective:    Patient ID: Margaret Schneider, female    DOB: October 10, 1976, 39 y.o.   MRN: [id number]  HPI   34 /F, smoker Hx of polysubstance abuse for FU of sarcoidosis  Admitted 04/29/10 for increased dyspnea. CXR and CT showed interstitial lung dz. TBBXshowed several non caseating granulomata associated w/ mulitnucleated giant cells.  ACE level was 212. She does continue to smoke. and drink alcohol daily. She denies any cocaine use .  Hospital workup showed neg HIV, Hepatitis panel neg, Mild LFTs increase. PFT showed FEV1 at 1.64 (56%), positive BD response. ratio of 81. DLCO 43  Has been on prednisone intermittently for 'flares'  01/17/2015 Acute OV : Sarcoid  Presents for an acute office visit.  Complains of increased SOB, fatigue, hoarseness, wheezing, tightness, dry cough. Last seen 6 months ago for sarcoid flare , started on pred .  Did not go for CXR or labs.  Does not keep recommended follow up ov.  Last cxr 2014 with nad . Did not return for PFT .  Still smoking , advised on smoking cessation.  Denies drug use.  She denies any hemoptysis, chest pain, orthopnea, PND or leg swelling.      Review of SystemsConstitutional:   No  weight loss, night sweats,  Fevers, chills, + fatigue, or  lassitude.  HEENT:   No headaches,  Difficulty swallowing,  Tooth/dental problems, or  Sore throat,                No sneezing, itching, ear ache, + nasal congestion, post nasal drip,   CV:  No chest pain,  Orthopnea, PND, swelling in lower extremities, anasarca, dizziness, palpitations, syncope.   GI  No heartburn, indigestion, abdominal pain, nausea, vomiting, diarrhea, change in bowel habits, loss of appetite, bloody stools.   Resp:  .  No chest wall deformity  Skin: no rash or lesions.  GU: no dysuria, change in color of urine, no urgency or frequency.  No flank pain, no hematuria   MS:  No joint pain or swelling.  No decreased range of motion.  No back pain.  Psych:  No change in  mood or affect. No depression or anxiety.  No memory loss.          Objective:   Physical Exam  Gen. Pleasant, well-nourished, in no distress ENT - no lesions, no post nasal drip Neck: No JVD, no thyromegaly, no carotid bruits Lungs: no use of accessory muscles, no dullness to percussion, exp wheezing  Cardiovascular: Rhythm regular, heart sounds  normal, no murmurs or gallops, no peripheral edema Musculoskeletal: No deformities, no cyanosis or clubbing        Assessment & Plan:

## 2015-01-17 NOTE — Patient Instructions (Signed)
Prednisone taper over next week.  Begin Symbicort 160/4.74mcg .2 puffs Twice daily   Chest xray today .  Follow up Dr. Elsworth Soho  In 6 weeks and As needed   Please contact office for sooner follow up if symptoms do not improve or worsen or seek emergency care

## 2015-01-24 NOTE — Assessment & Plan Note (Signed)
Flare  Check cxr  Plan  Prednisone taper over next week.  Begin Symbicort 160/4.72mcg .2 puffs Twice daily   Chest xray today .  Follow up Dr. Elsworth Soho  In 6 weeks and As needed   Please contact office for sooner follow up if symptoms do not improve or worsen or seek emergency care

## 2015-02-01 ENCOUNTER — Encounter: Payer: Self-pay | Admitting: Emergency Medicine

## 2015-02-01 NOTE — Progress Notes (Signed)
Quick Note:  Letter sent due to several unsuccessful attempts in trying to reach pt. Will sign off. ______

## 2015-02-06 ENCOUNTER — Telehealth: Payer: Self-pay | Admitting: Pulmonary Disease

## 2015-02-06 NOTE — Telephone Encounter (Signed)
Notes Recorded by Melvenia Needles, NP on 01/19/2015 at 6:01 PM cxr is clear  Cont w/ ov recs  Please contact office for sooner follow up if symptoms do not improve or worsen or seek emergency care --  I spoke with patient about results and she verbalized understanding and had no questions

## 2015-03-13 ENCOUNTER — Encounter: Payer: Self-pay | Admitting: Pulmonary Disease

## 2015-03-13 ENCOUNTER — Ambulatory Visit (INDEPENDENT_AMBULATORY_CARE_PROVIDER_SITE_OTHER): Payer: Self-pay | Admitting: Pulmonary Disease

## 2015-03-13 VITALS — BP 130/92 | HR 88 | Ht 66.0 in | Wt 134.4 lb

## 2015-03-13 DIAGNOSIS — J449 Chronic obstructive pulmonary disease, unspecified: Secondary | ICD-10-CM

## 2015-03-13 DIAGNOSIS — D869 Sarcoidosis, unspecified: Secondary | ICD-10-CM

## 2015-03-13 NOTE — Assessment & Plan Note (Signed)
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

## 2015-03-13 NOTE — Progress Notes (Signed)
   Subjective:    Patient ID: Margaret Schneider, female    DOB: 07/31/1976, 39 y.o.   MRN: [id number]  HPI  71 /F, smoker Hx of polysubstance abuse for FU of sarcoidosis   Has been on prednisone intermittently for 'flares'   03/13/2015  Chief Complaint  Patient presents with  . Follow-up    Hoarse x 2 months; SOB; chest tightness; wheezing; pt wants prednisone    01/17/2015 Acute OV : Sarcoid  CXR  - clear She complains of a hoarse voice, Prednisone seemed to make her better She ran out of Symbicort and has not used for the past few days She denies any hemoptysis, chest pain, orthopnea, PND or leg swelling.  She does continue to smoke.She denies any cocaine use .   Significant tests/ events] Admitted 04/29/10 for increased dyspnea. CXR and CT showed interstitial lung dz. TBBXshowed several non caseating granulomata associated w/ mulitnucleated giant cells.  ACE level was 212.  Hospital workup showed neg HIV, Hepatitis panel neg, Mild LFTs increase.  PFT 2011-  FEV1 at 1.64 (56%), positive BD response. ratio of 81. DLCO 43  Spirometry 02/2015 shows moderate airway obstruction with a ratio 69, FEV1 60%-1.58 and FVC 72%-2.30  Review of Systems neg for any significant sore throat, dysphagia, itching, sneezing, nasal congestion or excess/ purulent secretions, fever, chills, sweats, unintended wt loss, pleuritic or exertional cp, hempoptysis, orthopnea pnd or change in chronic leg swelling. Also denies presyncope, palpitations, heartburn, abdominal pain, nausea, vomiting, diarrhea or change in bowel or urinary habits, dysuria,hematuria, rash, arthralgias, visual complaints, headache, numbness weakness or ataxia.     Objective:   Physical Exam  Gen. Pleasant, well-nourished, in no distress ENT - no lesions, no post nasal drip Neck: No JVD, no thyromegaly, no carotid bruits Lungs: no use of accessory muscles, no dullness to percussion, clear without rales or rhonchi    Cardiovascular: Rhythm regular, heart sounds  normal, no murmurs or gallops, no peripheral edema Musculoskeletal: No deformities, no cyanosis or clubbing        Assessment & Plan:

## 2015-03-13 NOTE — Patient Instructions (Signed)
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

## 2015-03-14 NOTE — Assessment & Plan Note (Signed)
I doubt that her sarcoidosis is still active Not sure whether prednisone is helping inflammation of COPD or sarcoidosis As such we'll try to minimize prednisone usage-and limited to flares

## 2015-04-24 ENCOUNTER — Ambulatory Visit: Payer: Self-pay | Admitting: Adult Health

## 2015-04-28 ENCOUNTER — Ambulatory Visit: Payer: Self-pay | Admitting: Adult Health

## 2015-07-10 ENCOUNTER — Encounter: Payer: Self-pay | Admitting: Adult Health

## 2015-07-10 ENCOUNTER — Ambulatory Visit (INDEPENDENT_AMBULATORY_CARE_PROVIDER_SITE_OTHER): Payer: Self-pay | Admitting: Adult Health

## 2015-07-10 VITALS — BP 134/80 | Temp 98.1°F | Ht 66.0 in | Wt 127.0 lb

## 2015-07-10 DIAGNOSIS — J449 Chronic obstructive pulmonary disease, unspecified: Secondary | ICD-10-CM

## 2015-07-10 DIAGNOSIS — D869 Sarcoidosis, unspecified: Secondary | ICD-10-CM

## 2015-07-10 MED ORDER — LEVALBUTEROL HCL 0.63 MG/3ML IN NEBU
0.6300 mg | INHALATION_SOLUTION | Freq: Once | RESPIRATORY_TRACT | Status: AC
Start: 2015-07-10 — End: 2015-07-10
  Administered 2015-07-10: 0.63 mg via RESPIRATORY_TRACT

## 2015-07-10 NOTE — Patient Instructions (Signed)
Prednisone taper over next week.  Restart Symbicort 2 puffs Twice daily  , rinse after use.  Patient Assistance Program paperwork for Symbicort.  Work on not smoking  Follow up Dr. Elsworth Soho  In 3 months and As needed

## 2015-07-11 NOTE — Progress Notes (Signed)
   Subjective:    Patient ID: Margaret Schneider, female    DOB: [birth year], 39 y.o.   MRN: [id number]  HPI 39 /F, smoker Hx of polysubstance abuse for FU of sarcoidosis   Significant tests/ events] Admitted 04/29/10 for increased dyspnea. CXR and CT showed interstitial lung dz. TBBXshowed several non caseating granulomata associated w/ mulitnucleated giant cells.  ACE level was 212. Hospital workup showed neg HIV, Hepatitis panel neg, Mild LFTs increase.  PFT 2011- FEV1 at 1.64 (56%), positive BD response. ratio of 81. DLCO 43  Spirometry 02/2015 shows moderate airway obstruction with a ratio 69, FEV1 60%-1.58 and FVC 72%-2.30  07/10/15 Acute OV  patient presents for an acute office visit. She complains of increased fatigue, shortness of breath, chest tightness and dry cough over the last couple weeks. Patient says she's not been able to afford her Symbicort. We discussed patient assistance programs. Patient denies any chest pain, orthopnea, PND, rash, visual changes, or hemoptysis. Chest x-ray earlier this year in March showed no acute process.   Review of Systems Constitutional:   No  weight loss, night sweats,  Fevers, chills, + fatigue, or  lassitude.  HEENT:   No headaches,  Difficulty swallowing,  Tooth/dental problems, or  Sore throat,                No sneezing, itching, ear ache,  +nasal congestion, post nasal drip,   CV:  No chest pain,  Orthopnea, PND, swelling in lower extremities, anasarca, dizziness, palpitations, syncope.   GI  No heartburn, indigestion, abdominal pain, nausea, vomiting, diarrhea, change in bowel habits, loss of appetite, bloody stools.   Resp:    No chest wall deformity  Skin: no rash or lesions.  GU: no dysuria, change in color of urine, no urgency or frequency.  No flank pain, no hematuria   MS:  No joint pain or swelling.  No decreased range of motion.  No back pain.  Psych:  No change in mood or affect. No depression or anxiety.  No  memory loss.         Objective:   Physical Exam  GEN: A/Ox3; pleasant , NAD, well nourished   HEENT:  [place]/AT,  EACs-clear, TMs-wnl, NOSE-clear, THROAT-clear, no lesions, no postnasal drip or exudate noted. Poor dentition  NECK:  Supple w/ fair ROM; no JVD; normal carotid impulses w/o bruits; no thyromegaly or nodules palpated; no lymphadenopathy.  RESP  decreased breath sounds in the bases w/o, wheezes/ rales/ or rhonchi.no accessory muscle use, no dullness to percussion  CARD:  RRR, no m/r/g  , no peripheral edema, pulses intact, no cyanosis or clubbing.  GI:   Soft & nt; nml bowel sounds; no organomegaly or masses detected.  Musco: Warm bil, no deformities or joint swelling noted.   Neuro: alert, no focal deficits noted.    Skin: Warm, no lesions or rashes        Assessment & Plan:

## 2015-07-11 NOTE — Assessment & Plan Note (Signed)
Flare   Plan  Prednisone taper over next week.  Restart Symbicort 2 puffs Twice daily  , rinse after use.  Patient Assistance Program paperwork for Symbicort.  Work on not smoking  Follow up Dr. Elsworth Soho  In 3 months and As needed

## 2015-09-19 ENCOUNTER — Telehealth: Payer: Self-pay | Admitting: Adult Health

## 2015-09-19 NOTE — Telephone Encounter (Signed)
Called and spoke to pt. Pt is requesting a Symbicort 160 sample. One sample left up front for pick up. Pt verbalized understanding and denied any further questions or concerns at this time.

## 2015-10-24 ENCOUNTER — Emergency Department (HOSPITAL_COMMUNITY): Payer: No Typology Code available for payment source

## 2015-10-24 ENCOUNTER — Encounter (HOSPITAL_COMMUNITY): Payer: Self-pay | Admitting: *Deleted

## 2015-10-24 ENCOUNTER — Emergency Department (HOSPITAL_COMMUNITY)
Admission: EM | Admit: 2015-10-24 | Discharge: 2015-10-24 | Disposition: A | Discharge status: Home / Self Care | Payer: No Typology Code available for payment source | Attending: Emergency Medicine | Admitting: Emergency Medicine

## 2015-10-24 DIAGNOSIS — F1099 Alcohol use, unspecified with unspecified alcohol-induced disorder: Secondary | ICD-10-CM | POA: Diagnosis not present

## 2015-10-24 DIAGNOSIS — D1720 Benign lipomatous neoplasm of skin and subcutaneous tissue of unspecified limb: Secondary | ICD-10-CM

## 2015-10-24 DIAGNOSIS — Z7289 Other problems related to lifestyle: Secondary | ICD-10-CM

## 2015-10-24 DIAGNOSIS — Z8719 Personal history of other diseases of the digestive system: Secondary | ICD-10-CM | POA: Insufficient documentation

## 2015-10-24 DIAGNOSIS — Z7951 Long term (current) use of inhaled steroids: Secondary | ICD-10-CM | POA: Insufficient documentation

## 2015-10-24 DIAGNOSIS — D1723 Benign lipomatous neoplasm of skin and subcutaneous tissue of right leg: Secondary | ICD-10-CM | POA: Diagnosis not present

## 2015-10-24 DIAGNOSIS — G8929 Other chronic pain: Secondary | ICD-10-CM | POA: Insufficient documentation

## 2015-10-24 DIAGNOSIS — Z862 Personal history of diseases of the blood and blood-forming organs and certain disorders involving the immune mechanism: Secondary | ICD-10-CM | POA: Insufficient documentation

## 2015-10-24 DIAGNOSIS — F17210 Nicotine dependence, cigarettes, uncomplicated: Secondary | ICD-10-CM | POA: Diagnosis not present

## 2015-10-24 DIAGNOSIS — R202 Paresthesia of skin: Secondary | ICD-10-CM | POA: Diagnosis present

## 2015-10-24 DIAGNOSIS — Z789 Other specified health status: Secondary | ICD-10-CM

## 2015-10-24 LAB — CBC
HEMATOCRIT: 38.8 % (ref 36.0–46.0)
Hemoglobin: 13.5 g/dL (ref 12.0–15.0)
MCH: 37.1 pg — ABNORMAL HIGH (ref 26.0–34.0)
MCHC: 34.8 g/dL (ref 30.0–36.0)
MCV: 106.6 fL — AB (ref 78.0–100.0)
Platelets: 151 10*3/uL (ref 150–400)
RBC: 3.64 MIL/uL — AB (ref 3.87–5.11)
RDW: 12.4 % (ref 11.5–15.5)
WBC: 3.8 10*3/uL — AB (ref 4.0–10.5)

## 2015-10-24 LAB — COMPREHENSIVE METABOLIC PANEL
ALBUMIN: 4.3 g/dL (ref 3.5–5.0)
ALT: 43 U/L (ref 14–54)
AST: 121 U/L — AB (ref 15–41)
Alkaline Phosphatase: 86 U/L (ref 38–126)
Anion gap: 9 (ref 5–15)
BILIRUBIN TOTAL: 0.5 mg/dL (ref 0.3–1.2)
CHLORIDE: 103 mmol/L (ref 101–111)
CO2: 27 mmol/L (ref 22–32)
CREATININE: 0.66 mg/dL (ref 0.44–1.00)
Calcium: 9.3 mg/dL (ref 8.9–10.3)
GFR calc Af Amer: >60 mL/min (ref >60)
GLUCOSE: 102 mg/dL — AB (ref 65–99)
POTASSIUM: 4.3 mmol/L (ref 3.5–5.1)
Sodium: 139 mmol/L (ref 135–145)
TOTAL PROTEIN: 8.2 g/dL — AB (ref 6.5–8.1)

## 2015-10-24 MED ORDER — FOLIC ACID 1 MG PO TABS: 1.0000 mg | ORAL_TABLET | Freq: Every day | ORAL | Status: AC

## 2015-10-24 MED ORDER — MULTI-VITAMIN/MINERALS PO TABS: 1.0000 | ORAL_TABLET | Freq: Every day | ORAL | Status: AC

## 2015-10-24 NOTE — Discharge Instructions (Signed)
Stop drinking alcohol as this may be the cause of your symptoms. Follow up with the orthopedist listed above in 1-2 weeks for ongoing evaluation of your knee pain/symptoms. Take the multivitamin and folate as directed. Follow up with your regular doctor for monitoring of possible vitamin deficiencies that could be causing your symptoms. Return to the ER for changes or worsening symptoms.   Paresthesia Paresthesia is a burning or prickling feeling. This feeling can happen in any part of the body. It often happens in the hands, arms, legs, or feet. Usually, it is not painful. In most cases, the feeling goes away in a short time and is not a sign of a serious problem. HOME CARE  Avoid drinking alcohol.  Try massage or needle therapy (acupuncture) to help with your problems.  Keep all follow-up visits as told by your doctor. This is important. GET HELP IF:  You keep on having episodes of paresthesia.  Your burning or prickling feeling gets worse when you walk.  You have pain or cramps.  You feel dizzy.  You have a rash. GET HELP RIGHT AWAY IF:  You feel weak.  You have trouble walking or moving.  You have problems speaking, understanding, or seeing.  You feel confused.  You cannot control when you pee (urinate) or poop (bowel movement).  You lose feeling (numbness) after an injury.  You pass out (faint).   This information is not intended to replace advice given to you by your health care provider. Make sure you discuss any questions you have with your health care provider.   Document Released: 10/17/2008 Document Revised: 03/21/2015 Document Reviewed: 10/31/2014 Elsevier Interactive Patient Education 2016 [employer]  Lipoma A lipoma is a noncancerous (benign) tumor that is made up of fat cells. This is a very common type of soft-tissue growth. Lipomas are usually found under the skin (subcutaneous). They may occur in any tissue of the body that contains fat. Common areas  for lipomas to appear include the back, shoulders, buttocks, and thighs. Lipomas grow slowly, and they are usually painless. Most lipomas do not cause problems and do not require treatment. CAUSES The cause of this condition is not known. RISK FACTORS This condition is more likely to develop in:  People who are 56-24 years old.  People who have a family history of lipomas. SYMPTOMS A lipoma usually appears as a small, round bump under the skin. It may feel soft or rubbery, but the firmness can vary. Most lipomas are not painful. However, a lipoma may become painful if it is located in an area where it pushes on nerves. DIAGNOSIS A lipoma can usually be diagnosed with a physical exam. You may also have tests to confirm the diagnosis and to rule out other conditions. Tests may include:  Imaging tests, such as a CT scan or MRI.  Removal of a tissue sample to be looked at under a microscope (biopsy). TREATMENT Treatment is not needed for small lipomas that are not causing problems. If a lipoma continues to get bigger or it causes problems, removal is often the best option. Lipomas can also be removed to improve appearance. Removal of a lipoma is usually done with a surgery in which the fatty cells and the surrounding capsule are removed. Most often, a medicine that numbs the area (local anesthetic) is used for this procedure. HOME CARE INSTRUCTIONS  Keep all follow-up visits as directed by your health care provider. This is important. SEEK MEDICAL CARE IF:  Your lipoma  becomes larger or hard.  Your lipoma becomes painful, red, or increasingly swollen. These could be signs of infection or a more serious condition.   This information is not intended to replace advice given to you by your health care provider. Make sure you discuss any questions you have with your health care provider.   Document Released: 10/25/2002 Document Revised: 03/21/2015 Document Reviewed: 10/31/2014 Elsevier  Interactive Patient Education 2016 Else[employer] Inc.  Peripheral Neuropathy Peripheral neuropathy is a type of nerve damage. It affects nerves that carry signals between the spinal cord and other parts of the body. These are called peripheral nerves. With peripheral neuropathy, one nerve or a group of nerves may be damaged.  CAUSES  Many things can damage peripheral nerves. For some people with peripheral neuropathy, the cause is unknown. Some causes include:  Diabetes. This is the most common cause of peripheral neuropathy.  Injury to a nerve.  Pressure or stress on a nerve that lasts a long time.  Too little vitamin B. Alcoholism can lead to this.  Infections.  Autoimmune diseases, such as multiple sclerosis and systemic lupus erythematosus.  Inherited nerve diseases.  Some medicines, such as cancer drugs.  Toxic substances, such as lead and mercury.  Too little blood flowing to the legs.  Kidney disease.  Thyroid disease. SIGNS AND SYMPTOMS  Different people have different symptoms. The symptoms you have will depend on which of your nerves is damaged. Common symptoms include:  Loss of feeling (numbness) in the feet and hands.  Tingling in the feet and hands.  Pain that burns.  Very sensitive skin.  Weakness.  Not being able to move a part of the body (paralysis).  Muscle twitching.  Clumsiness or poor coordination.  Loss of balance.  Not being able to control your bladder.  Feeling dizzy.  Sexual problems. DIAGNOSIS  Peripheral neuropathy is a symptom, not a disease. Finding the cause of peripheral neuropathy can be hard. To figure that out, your health care provider will take a medical history and do a physical exam. A neurological exam will also be done. This involves checking things affected by your brain, spinal cord, and nerves (nervous system). For example, your health care provider will check your reflexes, how you move, and what you can feel.  Other  types of tests may also be ordered, such as:  Blood tests.  A test of the fluid in your spinal cord.  Imaging tests, such as CT scans or an MRI.  Electromyography (EMG). This test checks the nerves that control muscles.  Nerve conduction velocity tests. These tests check how fast messages pass through your nerves.  Nerve biopsy. A small piece of nerve is removed. It is then checked under a microscope. TREATMENT   Medicine is often used to treat peripheral neuropathy. Medicines may include:  Pain-relieving medicines. Prescription or over-the-counter medicine may be suggested.  Antiseizure medicine. This may be used for pain.  Antidepressants. These also may help ease pain from neuropathy.  Lidocaine. This is a numbing medicine. You might wear a patch or be given a shot.  Mexiletine. This medicine is typically used to help control irregular heart rhythms.  Surgery. Surgery may be needed to relieve pressure on a nerve or to destroy a nerve that is causing pain.  Physical therapy to help movement.  Assistive devices to help movement. HOME CARE INSTRUCTIONS   Only take over-the-counter or prescription medicines as directed by your health care provider. Follow the instructions carefully for any given  medicines. Do not take any other medicines without first getting approval from your health care provider.  If you have diabetes, work closely with your health care provider to keep your blood sugar under control.  If you have numbness in your feet:  Check every day for signs of injury or infection. Watch for redness, warmth, and swelling.  Wear padded socks and comfortable shoes. These help protect your feet.  Do not do things that put pressure on your damaged nerve.  Do not smoke. Smoking keeps blood from getting to damaged nerves.  Avoid or limit alcohol. Too much alcohol can cause a lack of B vitamins. These vitamins are needed for healthy nerves.  Develop a good support  system. Coping with peripheral neuropathy can be stressful. Talk to a mental health specialist or join a support group if you are struggling.  Follow up with your health care provider as directed. SEEK MEDICAL CARE IF:   You have new signs or symptoms of peripheral neuropathy.  You are struggling emotionally from dealing with peripheral neuropathy.  You have a fever. SEEK IMMEDIATE MEDICAL CARE IF:   You have an injury or infection that is not healing.  You feel very dizzy or begin vomiting.  You have chest pain.  You have trouble breathing.   This information is not intended to replace advice given to you by your health care provider. Make sure you discuss any questions you have with your health care provider.   Document Released: 10/25/2002 Document Revised: 07/17/2011 Document Reviewed: 07/12/2013 Elsevier Interactive Patient Education [employer].

## 2015-10-24 NOTE — ED Notes (Signed)
Pt stable, ambulatory, states understanding of discharge instructions 

## 2015-10-24 NOTE — ED Provider Notes (Signed)
CSN: [id number]     Arrival date & time 10/24/15  1226 History   First MD Initiated Contact with Patient 10/24/15 1510     No chief complaint on file.    (Consider location/radiation/quality/duration/timing/severity/associated sxs/prior Treatment) HPI Comments: Margaret Schneider is a 39 y.o. female with a PMHx of sarcoidosis, chronic abd pain, tobacco use, gastritis, and dental caries, who presents to the ED with complaints of 2-3 weeks of bilateral lower extremity tingling which she describes as 8/10 constant pain radiating from her knees to her feet, worse with sitting, and unrelieved with ibuprofen. She states that 2 months ago she noticed a mass growing on the lateral aspect of her right knee. It is mobile and nontender. There is no erythema or warmth, and has not grown in size. She states that the tingling in her legs caused her to fall twice, but she denies any weakness. She admits to drinking three 40 ounce beers per week. She has no known injuries that preceded the tingling in her knees. She denies any fevers, chills, chest pain, shortness breath, abdominal pain, nausea, vomiting, diarrhea, constipation, dysuria, hematuria, numbness, weakness, headache, vision changes, lightheadedness, erythema or warmth, or knee swelling.  Patient is a 39 y.o. female presenting with knee pain. The history is provided by the patient. No language interpreter was used.  Knee Pain Location:  Knee Time since incident:  2 weeks Injury: no   Knee location:  R knee and L knee Pain details:    Quality:  Tingling   Radiates to:  R leg and L leg   Severity:  Moderate   Onset quality:  Gradual   Duration:  2 weeks   Timing:  Constant   Progression:  Unchanged Chronicity:  New Prior injury to area:  No Relieved by:  Nothing Exacerbated by: sitting still. Ineffective treatments:  NSAIDs Associated symptoms: tingling   Associated symptoms: no decreased ROM, no fever, no muscle weakness, no numbness and no  swelling     Past Medical History  Diagnosis Date  . Sarcoidosis ([place])   . Dyspnea   . Abdominal pain, generalized   . STD (sexually transmitted disease) complicating pregnancy, delivered   . Decreased visual acuity   . Dental caries   . Routine gynecological examination   . Tobacco abuse   . Gastritis    Past Surgical History  Procedure Laterality Date  . Tubal ligation    . Sweat glands removed     Family History  Problem Relation Age of Onset  . Asthma Maternal Aunt     x 2  . Rheum arthritis Maternal Grandfather   . Cancer    . Breast cancer Maternal Aunt     x 2  . Diabetes Maternal Grandfather   . Diabetes Cousin    Social History  Substance Use Topics  . Smoking status: Current Every Day Smoker -- 0.20 packs/day for 21 years    Types: Cigarettes  . Smokeless tobacco: Never Used     Comment: started smoking at age 23  . Alcohol Use: Yes     Comment: 1 40oz beer three times weekly   OB History    No data available     Review of Systems  Constitutional: Negative for fever and chills.  Eyes: Negative for visual disturbance.  Respiratory: Negative for shortness of breath.   Cardiovascular: Negative for chest pain and leg swelling.  Gastrointestinal: Negative for nausea, vomiting, abdominal pain, diarrhea and constipation.  Genitourinary: Negative for dysuria  and hematuria.  Musculoskeletal: Positive for myalgias. Negative for arthralgias.  Skin: Negative for color change and wound.  Allergic/Immunologic: Negative for immunocompromised state.  Neurological: Negative for weakness, light-headedness, numbness and headaches.       +tingling in BLEs  Psychiatric/Behavioral: Negative for confusion.   10 Systems reviewed and are negative for acute change except as noted in the HPI.    Allergies  Review of patient's allergies indicates no known allergies.  Home Medications   Prior to Admission medications   Medication Sig Start Date End Date Taking?  Authorizing Provider  albuterol (VENTOLIN HFA) 108 (90 BASE) MCG/ACT inhaler Inhale 2 puffs into the lungs every 6 (six) hours as needed. 01/17/15   Tammy S Parrett, NP  budesonide-formoterol (SYMBICORT) 160-4.5 MCG/ACT inhaler Inhale 2 puffs into the lungs 2 (two) times daily. 01/17/15   Tammy S Parrett, NP   BP 132/96 mmHg  Pulse 78  Temp(Src) 98.1 F (36.7 C) (Oral)  Resp 18  Ht 5\' 6"  (1.676 m)  SpO2 98%  LMP 10/24/2015 Physical Exam  Constitutional: She is oriented to person, place, and time. Vital signs are normal. She appears well-developed and well-nourished.  Non-toxic appearance. No distress.  Afebrile, nontoxic, NAD  HENT:  Head: Normocephalic and atraumatic.  Mouth/Throat: Oropharynx is clear and moist and mucous membranes are normal.  Eyes: Conjunctivae and EOM are normal. Right eye exhibits no discharge. Left eye exhibits no discharge.  Neck: Normal range of motion. Neck supple.  Cardiovascular: Normal rate, regular rhythm, normal heart sounds and intact distal pulses.  Exam reveals no gallop and no friction rub.   No murmur heard. Pulmonary/Chest: Effort normal and breath sounds normal. No respiratory distress. She has no decreased breath sounds. She has no wheezes. She has no rhonchi. She has no rales.  Abdominal: Soft. Normal appearance and bowel sounds are normal. She exhibits no distension. There is no tenderness. There is no rigidity, no rebound and no guarding.  Musculoskeletal: Normal range of motion.       Right knee: She exhibits swelling (mass/lipoma). She exhibits normal range of motion, no effusion, no laceration, no erythema, normal alignment, no LCL laxity, normal patellar mobility and no MCL laxity. No tenderness found.       Left knee: Normal.       Legs: 4cm x 2cm mobile nontender mass c/w lipoma noted to the lateral R knee, no erythema or warmth. B/l knees with FROM intact, no joint line or bony TTP, no swelling/effusion/deformity, no bruising or erythema, no  warmth, no abnormal alignment or patellar mobility, no varus/valgus laxity, neg anterior drawer test, no crepitus. Strength and sensation grossly intact, distal pulses intact. Extremities warm and dry. No pedal edema.  Neurological: She is alert and oriented to person, place, and time. She has normal strength. No sensory deficit. Gait normal.  Skin: Skin is warm, dry and intact. No rash noted.  Psychiatric: She has a normal mood and affect.  Nursing note and vitals reviewed.   ED Course  Procedures (including critical care time) Labs Review Labs Reviewed  CBC - Abnormal; Notable for the following:    WBC 3.8 (*)    RBC 3.64 (*)    MCV 106.6 (*)    MCH 37.1 (*)    All other components within normal limits  COMPREHENSIVE METABOLIC PANEL - Abnormal; Notable for the following:    Glucose, Bld 102 (*)    BUN <5 (*)    Total Protein 8.2 (*)    AST  121 (*)    All other components within normal limits    Imaging Review Dg Knee Complete 4 Views Right  10/24/2015  CLINICAL DATA:  Right knee pain, fall yesterday EXAM: RIGHT KNEE - COMPLETE 4+ VIEW COMPARISON:  None. FINDINGS: Four views of the right knee submitted. No acute fracture or subluxation. There is soft tissue prominence lateral aspect of the knee. Bursitis cannot be excluded. Clinical correlation is necessary. Further correlation with MRI could be performed as clinically warranted. IMPRESSION: No acute fracture or subluxation. There is soft tissue prominence lateral aspect of the knee. Bursitis cannot be excluded. Clinical correlation is necessary. Further correlation with MRI could be performed as clinically warranted. Electronically Signed   By: Lahoma Crocker M.D.   On: 10/24/2015 13:58   I have personally reviewed and evaluated these images and lab results as part of my medical decision-making.   EKG Interpretation None      MDM   Final diagnoses:  Paresthesias  Lipoma of lower extremity  Alcohol use ([place])    39 y.o. female  here with paresthesias in both legs for approximately 2-3 weeks, as well as a mass the right knee has been present for 2 months. Extremities neurovascularly intact with soft compartments, mass appears to be consistent with a lipoma and is nontender and mobile. X-ray obtained which revealed a soft tissue prominence the lateral aspect of the knee over the area with lipomas felt. Doubt need for emergent MRI at this time. Doubt that this is consistent with bursitis. No erythema or warmth, doubt abscess. Labs reviewed and were unremarkable other than an AST of 121 which is consistent with her alcohol use. Discussed that alcohol use can lead to vitamin deficiencies that could be the source of her symptoms. Discussed alcohol cessation as well as starting her on a multivitamin and folate supplements. Discussed follow-up with her PCP for formal vitamin D deficiency testing as well as ongoing monitoring. Will have her follow-up with orthopedist for possible MRI and potential excision of this lipoma given that it is causing her discomfort. I explained the diagnosis and have given explicit precautions to return to the ER including for any other new or worsening symptoms. The patient understands and accepts the medical plan as it's been dictated and I have answered their questions. Discharge instructions concerning home care and prescriptions have been given. The patient is STABLE and is discharged to home in good condition.   BP 126/98 mmHg  Pulse 78  Temp(Src) 98.1 F (36.7 C) (Oral)  Resp 17  Ht 5\' 6"  (1.676 m)  SpO2 99%  LMP 10/24/2015  Meds ordered this encounter  Medications  . Multiple Vitamins-Minerals (MULTIVITAMIN WITH MINERALS) tablet    Sig: Take 1 tablet by mouth daily.    Dispense:  30 tablet    Refill:  1    Order Specific Question:  Supervising Provider    Answer:  MILLER, BRIAN [[id number]]  . folic acid (FOLVITE) 1 MG tablet    Sig: Take 1 tablet (1 mg total) by mouth daily.    Dispense:  30  tablet    Refill:  1    Order Specific Question:  Supervising Provider    Answer:  Noemi Chapel [[id number]]     Petrita Blunck Camprubi-Soms, PA-C 10/24/15 [place], DO 10/24/15 1703

## 2015-10-24 NOTE — ED Notes (Addendum)
Pt c/o bil knee numbness, pt reports a knot to the R lateral knee, pt reports a fall today onto couch, denies hitting head, -LOC, pt reports onset of symptoms x 2 mths, pt reports pin & needle sensation from her bil knees to her bil feet, denies bowel & bladder incontinence, pt ambulatory,pt c/o numbness sensation radiating to bil upper extremities, pt denies slurred speech,  pt A&O x4, pt reports no appetite & significant wt loss over the last 2 mths

## 2015-11-02 ENCOUNTER — Other Ambulatory Visit: Payer: Self-pay | Admitting: Sports Medicine

## 2015-11-02 DIAGNOSIS — M25561 Pain in right knee: Secondary | ICD-10-CM

## 2015-11-05 ENCOUNTER — Ambulatory Visit
Admission: RE | Admit: 2015-11-05 | Discharge: 2015-11-05 | Disposition: A | Discharge status: Home / Self Care | Payer: No Typology Code available for payment source | Source: Ambulatory Visit | Attending: Sports Medicine | Admitting: Sports Medicine

## 2015-11-05 DIAGNOSIS — M25561 Pain in right knee: Secondary | ICD-10-CM

## 2015-12-27 ENCOUNTER — Ambulatory Visit: Payer: Self-pay | Admitting: Pulmonary Disease

## 2016-01-07 ENCOUNTER — Encounter (HOSPITAL_COMMUNITY): Payer: Self-pay | Admitting: Emergency Medicine

## 2016-01-07 ENCOUNTER — Emergency Department (HOSPITAL_COMMUNITY)
Admission: EM | Admit: 2016-01-07 | Discharge: 2016-01-08 | Disposition: A | Discharge status: Home / Self Care | Payer: No Typology Code available for payment source | Attending: Emergency Medicine | Admitting: Emergency Medicine

## 2016-01-07 DIAGNOSIS — Y92009 Unspecified place in unspecified non-institutional (private) residence as the place of occurrence of the external cause: Secondary | ICD-10-CM | POA: Insufficient documentation

## 2016-01-07 DIAGNOSIS — S8991XA Unspecified injury of right lower leg, initial encounter: Secondary | ICD-10-CM | POA: Insufficient documentation

## 2016-01-07 DIAGNOSIS — Z79899 Other long term (current) drug therapy: Secondary | ICD-10-CM | POA: Insufficient documentation

## 2016-01-07 DIAGNOSIS — Y998 Other external cause status: Secondary | ICD-10-CM | POA: Insufficient documentation

## 2016-01-07 DIAGNOSIS — M1990 Unspecified osteoarthritis, unspecified site: Secondary | ICD-10-CM | POA: Insufficient documentation

## 2016-01-07 DIAGNOSIS — W1839XA Other fall on same level, initial encounter: Secondary | ICD-10-CM | POA: Insufficient documentation

## 2016-01-07 DIAGNOSIS — Z8719 Personal history of other diseases of the digestive system: Secondary | ICD-10-CM | POA: Insufficient documentation

## 2016-01-07 DIAGNOSIS — M1711 Unilateral primary osteoarthritis, right knee: Secondary | ICD-10-CM

## 2016-01-07 DIAGNOSIS — Y9389 Activity, other specified: Secondary | ICD-10-CM | POA: Insufficient documentation

## 2016-01-07 DIAGNOSIS — M25561 Pain in right knee: Secondary | ICD-10-CM

## 2016-01-07 DIAGNOSIS — F17210 Nicotine dependence, cigarettes, uncomplicated: Secondary | ICD-10-CM | POA: Insufficient documentation

## 2016-01-07 NOTE — ED Notes (Signed)
Pt. lost her balance and fell at home this afternoon , she added intermittent bilateral leg numbness for several weeks , ambulatory , denies pain or injury related to her fall .

## 2016-01-07 NOTE — ED Provider Notes (Signed)
CSN: [id number]     Arrival date & time 01/07/16  [id number] History  By signing my name below, I, Margaret Schneider, attest that this documentation has been prepared under the direction and in the presence of Veryl Speak, MD.   Electronically Signed: Nicole Schneider, ED Scribe. 01/07/2016. 12:06 AM     Chief Complaint  Patient presents with  . Fall  . Numbness    The history is provided by the patient. No language interpreter was used.   HPI Comments: Margaret Schneider is a 40 y.o. female with PMHx of sarcoidosis who presents to the Emergency Department complaining of gradual onset, constant leg pain, onset a few weeks ago. Pt complains that her knees have been bothering her and states that her circulation has been effected. She reports associated numbness in her feet that has caused her to fall multiple times in the past few weeks. Pt has seen a doctor for her pain and says she has been recommended for surgery on her knees. Pt reports her current pain medication is not providing relief to her symptoms and would like a stronger medication. No alleviating or worsening factors noted. Pt denies dizziness, weakness, or any other pertinent symptoms.     Past Medical History  Diagnosis Date  . Sarcoidosis ([place])   . Dyspnea   . Abdominal pain, generalized   . STD (sexually transmitted disease) complicating pregnancy, delivered   . Decreased visual acuity   . Dental caries   . Routine gynecological examination   . Tobacco abuse   . Gastritis    Past Surgical History  Procedure Laterality Date  . Tubal ligation    . Sweat glands removed     Family History  Problem Relation Age of Onset  . Asthma Maternal Aunt     x 2  . Rheum arthritis Maternal Grandfather   . Cancer    . Breast cancer Maternal Aunt     x 2  . Diabetes Maternal Grandfather   . Diabetes Cousin    Social History  Substance Use Topics  . Smoking status: Current Every Day Smoker -- 0.20 packs/day for 21 years   Types: Cigarettes  . Smokeless tobacco: Never Used     Comment: started smoking at age 83  . Alcohol Use: Yes     Comment: 1 40oz beer three times weekly   OB History    No data available     Review of Systems  Musculoskeletal: Positive for arthralgias.  Neurological: Positive for numbness.  All other systems reviewed and are negative.     Allergies  Review of patient's allergies indicates no known allergies.  Home Medications   Prior to Admission medications   Medication Sig Start Date End Date Taking? Authorizing Provider  albuterol (VENTOLIN HFA) 108 (90 BASE) MCG/ACT inhaler Inhale 2 puffs into the lungs every 6 (six) hours as needed. 01/17/15   Tammy S Parrett, NP  budesonide-formoterol (SYMBICORT) 160-4.5 MCG/ACT inhaler Inhale 2 puffs into the lungs 2 (two) times daily. Patient taking differently: Inhale 2 puffs into the lungs 2 (two) times daily as needed (for shortness of breath).  01/17/15   Tammy S Parrett, NP  folic acid (FOLVITE) 1 MG tablet Take 1 tablet (1 mg total) by mouth daily. 10/24/15   Mercedes Camprubi-Soms, PA-C  Multiple Vitamins-Minerals (MULTIVITAMIN WITH MINERALS) tablet Take 1 tablet by mouth daily. 10/24/15   Mercedes Camprubi-Soms, PA-C  predniSONE (DELTASONE) 5 MG tablet Take 5 mg by mouth daily with breakfast.  Historical Provider, MD   BP 151/109 mmHg  Pulse 88  Temp(Src) 98.4 F (36.9 C) (Oral)  Resp 18  SpO2 97% Physical Exam  Constitutional: She is oriented to person, place, and time. She appears well-developed and well-nourished. No distress.  HENT:  Head: Normocephalic and atraumatic.  Eyes: EOM are normal.  Neck: Normal range of motion.  Cardiovascular: Normal rate, regular rhythm and normal heart sounds.   Pulmonary/Chest: Effort normal and breath sounds normal.  Abdominal: Soft. She exhibits no distension. There is no tenderness.  Musculoskeletal: Normal range of motion. She exhibits tenderness.       Right knee: She exhibits no  swelling, no effusion and no deformity.  ROM normal in right knee. No crepitus. Stable AP/laterally.   Neurological: She is alert and oriented to person, place, and time.  Skin: Skin is warm and dry.  Psychiatric: She has a normal mood and affect. Judgment normal.  Nursing note and vitals reviewed.   ED Course  Procedures (including critical care time) DIAGNOSTIC STUDIES: Oxygen Saturation is 97% on RA, normal by my interpretation.    COORDINATION OF CARE: 12:06 AM-Discussed treatment plan which includes pain management and follow up with an orthopedist with pt at bedside and pt agreed to plan.     Labs Review Labs Reviewed - No data to display  Imaging Review No results found. I have personally reviewed and evaluated these images and lab results as part of my medical decision-making.    MDM   Final diagnoses:  None    Patient presents with complaints of ongoing knee pain. She recently had a cyst removed from the right knee and was also told that her cartilage is nearly gone. Her physical examination is unremarkable. She tells me the pain medication she is taking is not helping. She will be prescribed a small quantity of hydrocodone and I have advised her to call her orthopedist to arrange a follow-up appointment. Nothing today appears emergent and I believe she is appropriate for discharge.   I personally performed the services described in this documentation, which was scribed in my presence. The recorded information has been reviewed and is accurate.     Veryl Speak, MD 01/08/16 [phone]

## 2016-01-08 MED ORDER — HYDROCODONE-ACETAMINOPHEN 5-325 MG PO TABS: 1.0000 | ORAL_TABLET | Freq: Four times a day (QID) | ORAL | Status: DC | PRN

## 2016-01-08 NOTE — Discharge Instructions (Signed)
Hydrocodone as prescribed as needed for pain.  Please follow-up with your orthopedist regarding your right knee pain.   Joint Pain Joint pain, which is also called arthralgia, can be caused by many things. Joint pain often goes away when you follow your health care provider's instructions for relieving pain at home. However, joint pain can also be caused by conditions that require further treatment. Common causes of joint pain include:  Bruising in the area of the joint.  Overuse of the joint.  Wear and tear on the joints that occur with aging (osteoarthritis).  Various other forms of arthritis.  A buildup of a crystal form of uric acid in the joint (gout).  Infections of the joint (septic arthritis) or of the bone (osteomyelitis). Your health care provider may recommend medicine to help with the pain. If your joint pain continues, additional tests may be needed to diagnose your condition. HOME CARE INSTRUCTIONS Watch your condition for any changes. Follow these instructions as directed to lessen the pain that you are feeling.  Take medicines only as directed by your health care provider.  Rest the affected area for as long as your health care provider says that you should. If directed to do so, raise the painful joint above the level of your heart while you are sitting or lying down.  Do not do things that cause or worsen pain.  If directed, apply ice to the painful area:  Put ice in a plastic bag.  Place a towel between your skin and the bag.  Leave the ice on for 20 minutes, 2-3 times per day.  Wear an elastic bandage, splint, or sling as directed by your health care provider. Loosen the elastic bandage or splint if your fingers or toes become numb and tingle, or if they turn cold and blue.  Begin exercising or stretching the affected area as directed by your health care provider. Ask your health care provider what types of exercise are safe for you.  Keep all follow-up  visits as directed by your health care provider. This is important. SEEK MEDICAL CARE IF:  Your pain increases, and medicine does not help.  Your joint pain does not improve within 3 days.  You have increased bruising or swelling.  You have a fever.  You lose 10 lb (4.5 kg) or more without trying. SEEK IMMEDIATE MEDICAL CARE IF:  You are not able to move the joint.  Your fingers or toes become numb or they turn cold and blue.   This information is not intended to replace advice given to you by your health care provider. Make sure you discuss any questions you have with your health care provider.   Document Released: 11/04/2005 Document Revised: 11/25/2014 Document Reviewed: 08/16/2014 Elsevier Interactive Patient Education [employer].

## 2016-04-09 ENCOUNTER — Encounter: Payer: Self-pay | Admitting: Pulmonary Disease

## 2016-08-03 ENCOUNTER — Emergency Department (HOSPITAL_COMMUNITY): Payer: No Typology Code available for payment source

## 2016-08-03 ENCOUNTER — Emergency Department (HOSPITAL_COMMUNITY)
Admission: EM | Admit: 2016-08-03 | Discharge: 2016-08-03 | Disposition: A | Discharge status: Home / Self Care | Payer: No Typology Code available for payment source | Attending: Emergency Medicine | Admitting: Emergency Medicine

## 2016-08-03 ENCOUNTER — Encounter (HOSPITAL_COMMUNITY): Payer: Self-pay | Admitting: *Deleted

## 2016-08-03 DIAGNOSIS — S0990XA Unspecified injury of head, initial encounter: Secondary | ICD-10-CM | POA: Diagnosis not present

## 2016-08-03 DIAGNOSIS — Y92410 Unspecified street and highway as the place of occurrence of the external cause: Secondary | ICD-10-CM | POA: Insufficient documentation

## 2016-08-03 DIAGNOSIS — S93401A Sprain of unspecified ligament of right ankle, initial encounter: Secondary | ICD-10-CM | POA: Diagnosis not present

## 2016-08-03 DIAGNOSIS — Y939 Activity, unspecified: Secondary | ICD-10-CM | POA: Insufficient documentation

## 2016-08-03 DIAGNOSIS — S0083XA Contusion of other part of head, initial encounter: Secondary | ICD-10-CM | POA: Insufficient documentation

## 2016-08-03 DIAGNOSIS — Y999 Unspecified external cause status: Secondary | ICD-10-CM | POA: Diagnosis not present

## 2016-08-03 DIAGNOSIS — J449 Chronic obstructive pulmonary disease, unspecified: Secondary | ICD-10-CM | POA: Diagnosis not present

## 2016-08-03 DIAGNOSIS — S1191XA Laceration without foreign body of unspecified part of neck, initial encounter: Secondary | ICD-10-CM | POA: Insufficient documentation

## 2016-08-03 DIAGNOSIS — F17210 Nicotine dependence, cigarettes, uncomplicated: Secondary | ICD-10-CM | POA: Insufficient documentation

## 2016-08-03 LAB — POC URINE PREG, ED: PREG TEST UR: NEGATIVE

## 2016-08-03 MED ORDER — LIDOCAINE-EPINEPHRINE (PF) 2 %-1:200000 IJ SOLN
10.0000 mL | Freq: Once | INTRAMUSCULAR | Status: AC
  Administered 2016-08-03: 10 mL
  Filled 2016-08-03: qty 20

## 2016-08-03 MED ORDER — HYDROCODONE-ACETAMINOPHEN 5-325 MG PO TABS: 1.0000 | ORAL_TABLET | Freq: Four times a day (QID) | ORAL | 0 refills | Status: AC | PRN
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
## 2016-08-03 NOTE — ED Provider Notes (Signed)
LACERATION REPAIR Performed by: Nona Dell Authorized by: Nona Dell Consent: Verbal consent obtained. Risks and benefits: risks, benefits and alternatives were discussed Consent given by: patient Patient identity confirmed: provided demographic data Prepped and Draped in normal sterile fashion Wound explored  Laceration Location: right submandibular neck region  Laceration Length: 3cm  No Foreign Bodies seen or palpated  Anesthesia: local infiltration  Local anesthetic: lidocaine 2% with epinephrine  Anesthetic total: 2 ml  Irrigation method: syringe Amount of cleaning: standard  Skin closure: 5-0 prolene  Number of sutures: 4  Technique: simple interrupted  Patient tolerance: Patient tolerated the procedure well with no immediate complications.    Chesley Noon [place], [place] 08/03/16 [place], MD 08/07/16 [phone]
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
## 2016-08-03 NOTE — Progress Notes (Signed)
Orthopedic Tech Progress Note Patient Details:  Margaret Schneider 03/27/1976 [id number]  Ortho Devices Type of Ortho Device: Crutches Ortho Device/Splint Interventions: Ordered, Adjustment   Braulio Bosch 08/03/2016, 7:45 PM

## 2016-08-14 IMAGING — MR MR KNEE*R* W/O CM
6 of 7 series · 33 of 40 positions shown · non-contrast
Comparison: Dated 10/24/2015

CLINICAL DATA: Enlarging painful lateral right knee soft tissue
mass.

EXAM:
MRI OF THE RIGHT KNEE WITHOUT CONTRAST
TECHNIQUE: Multiplanar, multisequence MR imaging of the knee was performed. No
intravenous contrast was administered.

[Series 7: PD fat-sat · axial · right · 3.0mm · 0.39mm/px · z∈[-46,+69]mm · 6 of 36 slices shown (1 of 3)]
[im 1/36]
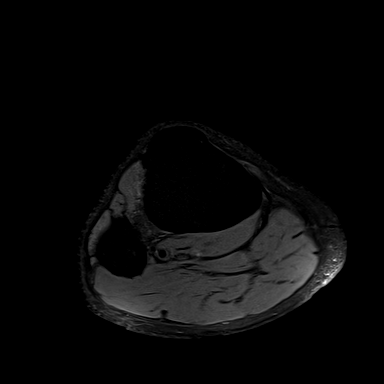
[im 8/36]
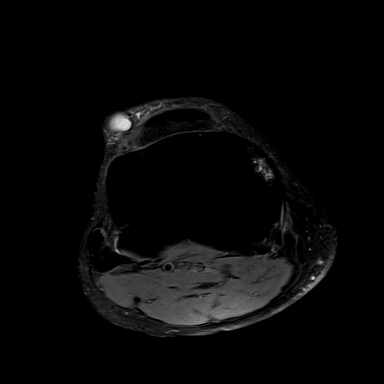
[im 15/36]
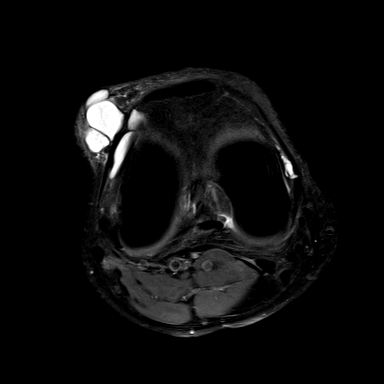
[im 22/36]
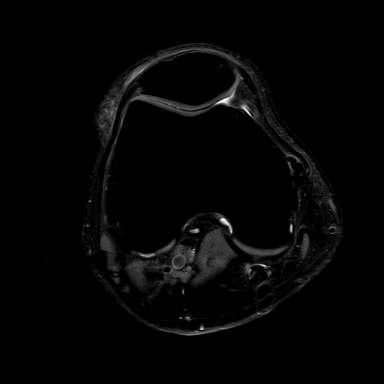
[im 29/36]
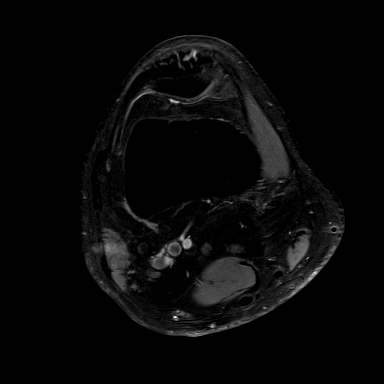
[im 36/36]
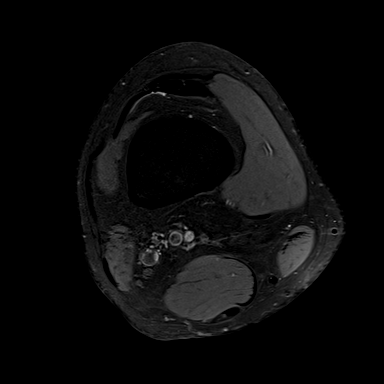

[Series 10: T2 fat-sat · coronal · right · 3.0mm · 0.39mm/px · 5 of 28 slices shown (1 of 2)]
[im 1/28]
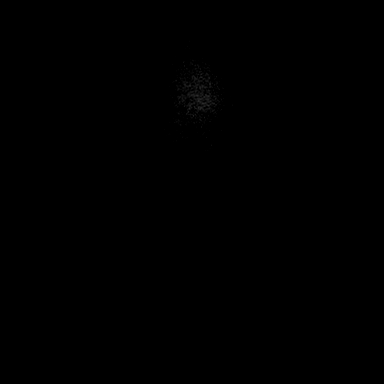
[im 7/28]
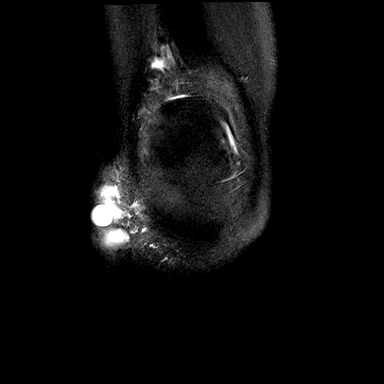
[im 14/28]
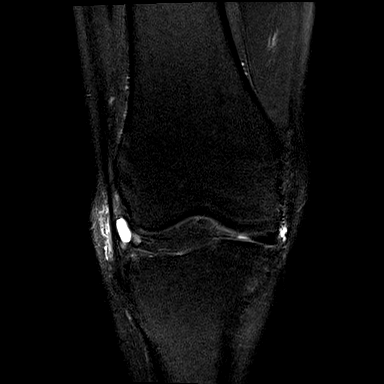
[im 21/28]
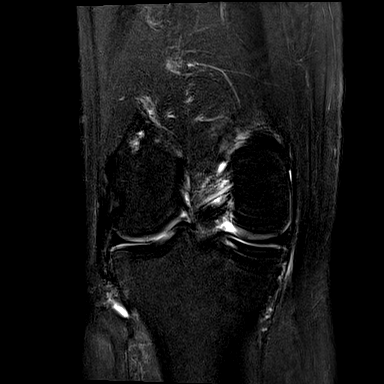
[im 28/28]
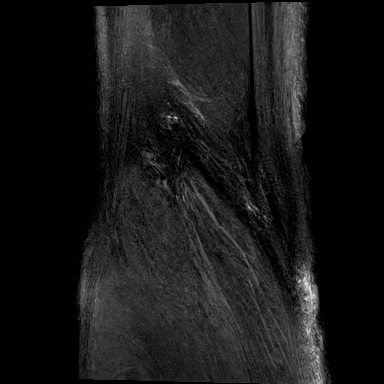

[Series 11: PD fat-sat · sagittal · right · 3.0mm · 0.39mm/px · 5 of 27 slices shown (2 of 3)]
[im 1/27]
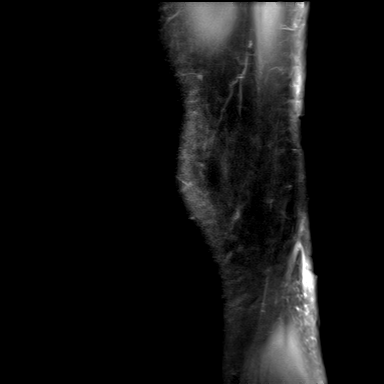
[im 7/27]
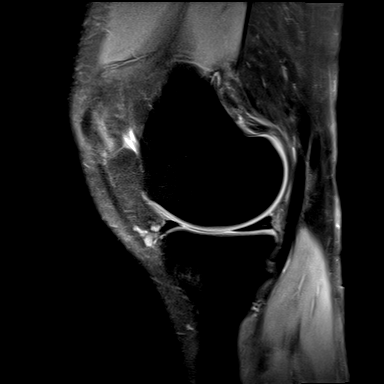
[im 14/27]
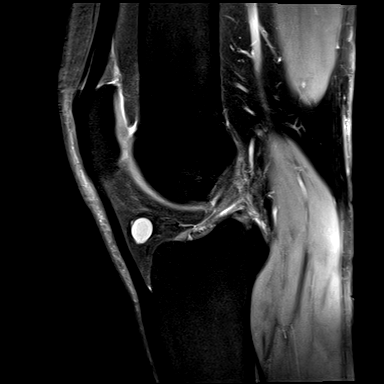
[im 20/27]
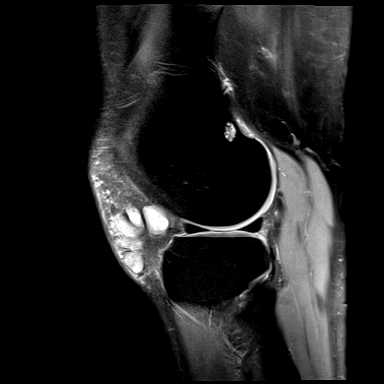
[im 27/27]
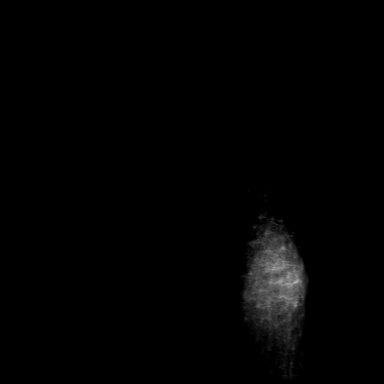

[Series 12: PD fat-sat · coronal · right · 3.0mm · 0.33mm/px · 5 of 28 slices shown (3 of 3)]
[im 1/28]
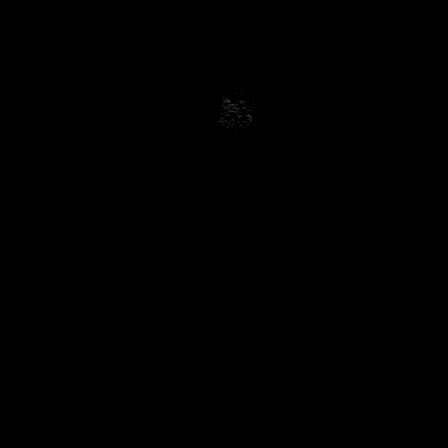
[im 7/28]
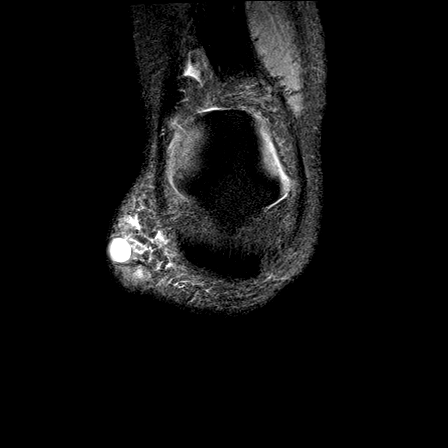
[im 14/28]
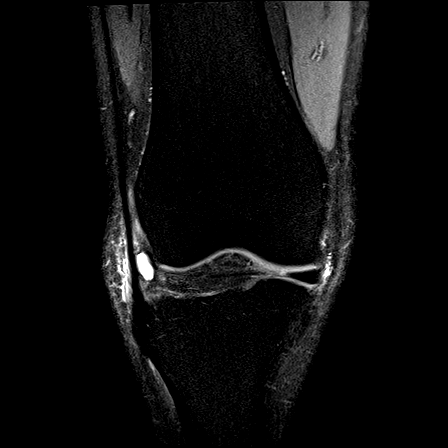
[im 21/28]
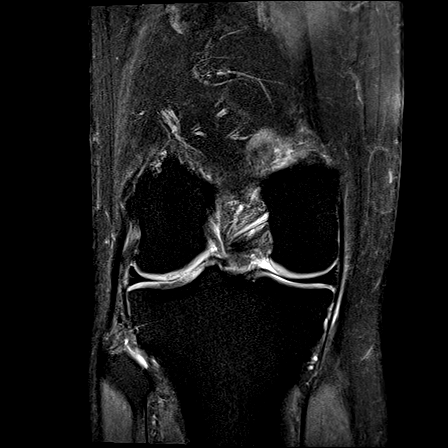
[im 28/28]
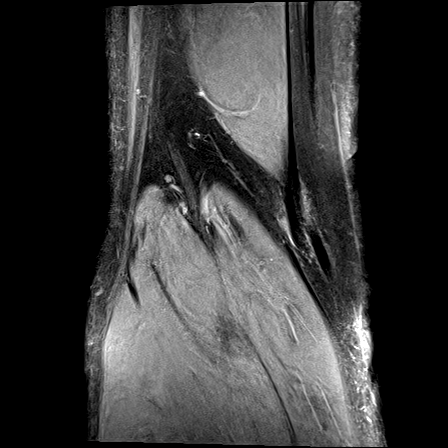

[Series 13: T2 fat-sat · axial · right · 3.0mm · 0.39mm/px · z∈[-46,+69]mm · 7 of 36 slices shown (2 of 2)]
[im 1/36]
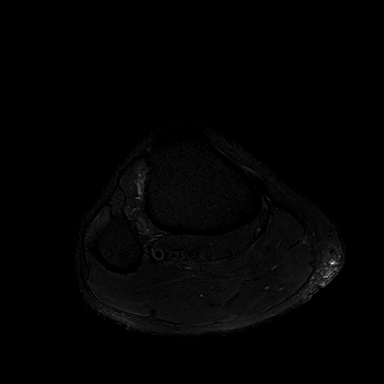
[im 6/36]
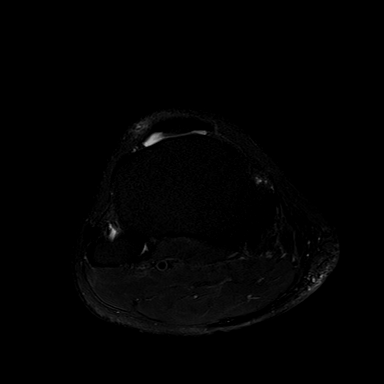
[im 12/36]
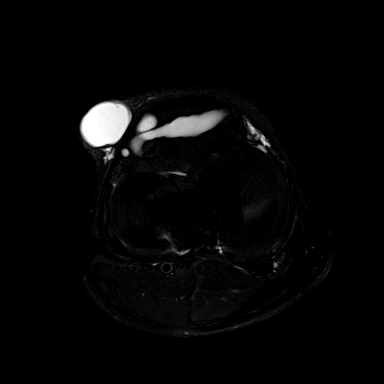
[im 18/36]
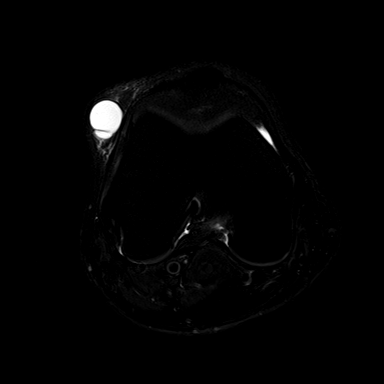
[im 24/36]
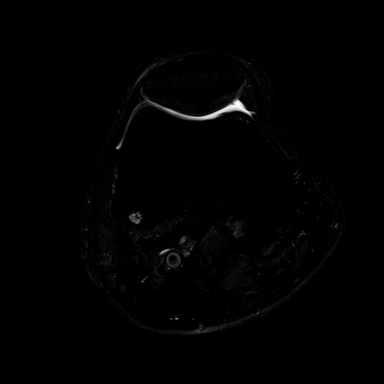
[im 30/36]
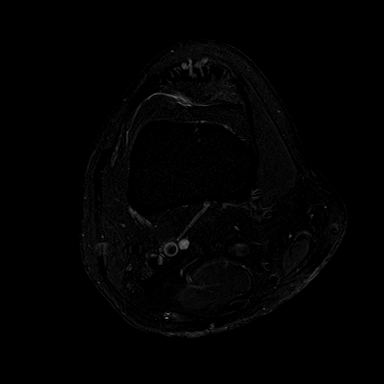
[im 36/36]
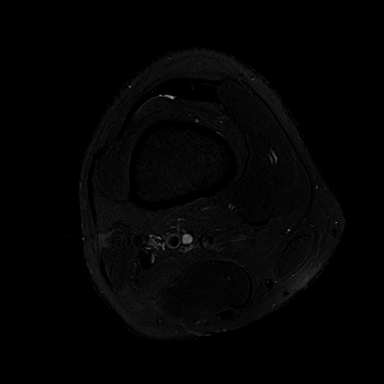

[Series 14: T1 · axial · right · 3.0mm · 0.47mm/px · z∈[-46,+29]mm · 5 of 36 slices shown]
[im 1/36]
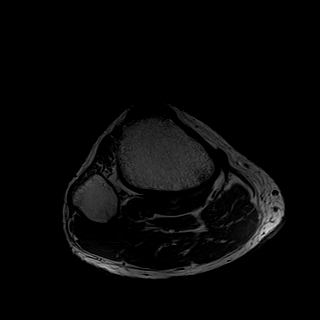
[im 6/36]
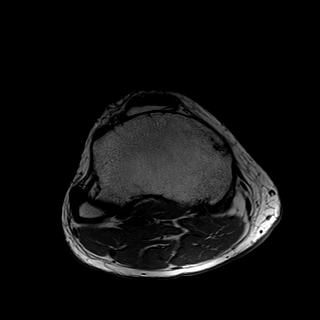
[im 12/36]
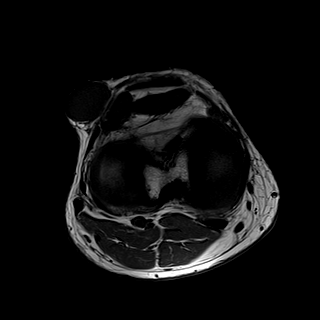
[im 18/36]
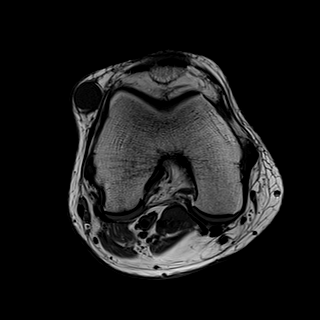
[im 24/36]
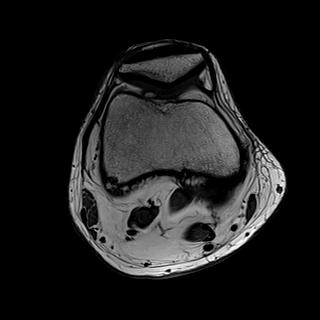

[33 of 40 positions shown; findings below may reference images not displayed]

FINDINGS: Joint: There is an extensive complex ganglion cyst extending from
pre free of the midbody of the medial meniscus anteriorly through
Hoffa's fat pad and through defect in the lateral patellofemoral
ligament into the subcutaneous soft tissues. The overall extent of
the cyst is approximately 4.3 x 2.3 x 8.3 cm.

MENISCI

Medial meniscus:  Normal.

Lateral meniscus:  Normal.

LIGAMENTS

Cruciates:  Normal.

Collaterals: Normal LCL. Focal irregularity and thickening of the
proximal MCL consistent with remote sprain. There is calcification
in the proximal MCL visible on radiographs.

CARTILAGE

Patellofemoral: Small focal area of grade 2 chondromalacia of the
medial facet of the patella.

Medial:  Normal.

Lateral: 12 x 3 mm irregular full-thickness cartilage defect in the
posterior aspect of the tibial plateau best seen on image 24 of
series 7.

Popliteal Fossa:  Normal.

Extensor Mechanism: Focal degenerative changes of the distal
quadriceps tendon. Normal patellar tendon. Focal defect in lateral
patellofemoral ligament on image 23 of series 7.

Bones: 7 mm intraosseous complex ganglion cyst in posterior aspect
of the distal femoral metaphysis. Less well-defined intraosseous
ganglion cyst in the medial aspect of the medial tibial epiphysis.
IMPRESSION: 1. Complex intra and extra-articular ganglion cyst extending from
medial to lateral through Hoffa's fat pad and through the lateral
patellofemoral ligament.
2. Focal full-thickness cartilage loss of the lateral tibial
plateau.
3. Old injury to the proximal MCL.

## 2016-12-18 ENCOUNTER — Ambulatory Visit (INDEPENDENT_AMBULATORY_CARE_PROVIDER_SITE_OTHER): Payer: BLUE CROSS/BLUE SHIELD | Admitting: Orthopaedic Surgery

## 2016-12-18 ENCOUNTER — Encounter (INDEPENDENT_AMBULATORY_CARE_PROVIDER_SITE_OTHER): Payer: Self-pay

## 2016-12-18 DIAGNOSIS — M23006 Cystic meniscus, unspecified meniscus, right knee: Secondary | ICD-10-CM | POA: Diagnosis not present

## 2016-12-18 NOTE — Progress Notes (Signed)
Office Visit Note   Patient: Margaret Schneider           Date of Birth: 1976/02/01           MRN: [id number] Visit Date: 12/18/2016              Requested by: Nolon Bussing, MD No address on file PCP: Nolon Bussing, MD   Assessment & Plan: Visit Diagnoses:  1. Meniscal cyst, right     Plan: At this point I do feel she would benefit from an open cyst excision to tie off the stalk of the cyst is coming from. She also needs an arthroscopic intervention to debride any changes within the joint including partial synovectomy laterally and assess in the cartilage in general. She understands this fully and does wish proceed with surgery. She tolerated the aspiration of her cyst well. I will see her back at one week postoperative. Her discussion of the risk medicine surgery were had in all questions were encouraged and answered.  Follow-Up Instructions: Return for 1 week post-op.   Orders:  No orders of the defined types were placed in this encounter.  No orders of the defined types were placed in this encounter.     Procedures: Large Joint Inj Date/Time: 12/18/2016 3:11 PM Performed by: Mcarthur Rossetti Authorized by: Mcarthur Rossetti   Location:  Knee Ultrasound Guidance: No   Fluoroscopic Guidance: No   Arthrogram: No   Aspirate:  Clear     Clinical Data: No additional findings.   Subjective: No chief complaint on file. The patient is been seen in our office before. 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 We drained this before in the office in December 2016. Is reoccurred and she wishes for surgical intervention at this point. We've had an MRI of her knee which does show the prior meniscal cyst as well as some cartilage irregularities in the knee joint itself. The meniscus itself are intact at a time.  HPI  Review of Systems She denies any chest pain, headache, fever, chills, nausea, vomiting.  Objective: Vital Signs:  There were no vitals taken for this visit.  Physical Exam He is alert and oriented 3 and in no acute distress Ortho Exam N/A shoulder right knee shows a large mass to the lateral aspect of the knee consistent with a ganglion cyst. I was able to clean the cerebellum and alcohol and aspirated a large abundant amount of gelatinous material consistent with a benign ganglion cyst. She's got good range of motion of her knee overall. The knee is ligamentously stable and there is no effusion. Specialty Comments:  No specialty comments available.  Imaging: No results found. I did independently review her previous right knee MRI as well as plain films that shows the extent of cartilage disease in the lateral compartment of her knee as well as the meniscal cystic changes.  PMFS History: Patient Active Problem List   Diagnosis Date Noted  . COPD (chronic obstructive pulmonary disease) ([place]) 03/13/2015  . 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 SEXUALLY TRANSMITTED DISEASE 05/19/2008  . VISUAL ACUITY, DECREASED 05/19/2008  . DENTAL CARIES 05/19/2008  . ABDOMINAL PAIN, GENERALIZED 05/19/2008  . TOBACCO ABUSE 12/21/2007  . GASTRITIS 12/21/2007   Past Medical History:  Diagnosis Date  . Abdominal pain, generalized   . Decreased visual acuity   . Dental caries   . Dyspnea   . Gastritis   . Routine gynecological examination   .  Sarcoidosis ([place])   . STD (sexually transmitted disease) complicating pregnancy, delivered   . Tobacco abuse     Family History  Problem Relation Age of Onset  . Asthma Maternal Aunt     x 2  . Rheum arthritis Maternal Grandfather   . Diabetes Maternal Grandfather   . Cancer    . Breast cancer Maternal Aunt     x 2  . Diabetes Cousin     Past Surgical History:  Procedure Laterality Date  . sweat glands removed    . TUBAL LIGATION     Social History   Occupational History  . Not on file.   Social History Main Topics  . Smoking status: Current Every Day  Smoker    Packs/day: 0.20    Years: 21.00    Types: Cigarettes  . Smokeless tobacco: Never Used     Comment: started smoking at age 31  . Alcohol use Yes     Comment: 1 40oz beer three times weekly  . Drug use: No  . Sexual activity: Not on file

## 2017-01-09 ENCOUNTER — Inpatient Hospital Stay (INDEPENDENT_AMBULATORY_CARE_PROVIDER_SITE_OTHER): Payer: BLUE CROSS/BLUE SHIELD | Admitting: Orthopaedic Surgery

## 2017-03-05 ENCOUNTER — Ambulatory Visit (INDEPENDENT_AMBULATORY_CARE_PROVIDER_SITE_OTHER): Payer: BLUE CROSS/BLUE SHIELD | Admitting: Orthopaedic Surgery

## 2017-03-26 ENCOUNTER — Ambulatory Visit (INDEPENDENT_AMBULATORY_CARE_PROVIDER_SITE_OTHER): Payer: BLUE CROSS/BLUE SHIELD | Admitting: Orthopaedic Surgery

## 2017-03-26 ENCOUNTER — Encounter (INDEPENDENT_AMBULATORY_CARE_PROVIDER_SITE_OTHER): Payer: Self-pay

## 2017-07-16 ENCOUNTER — Emergency Department (HOSPITAL_COMMUNITY)
Admission: EM | Admit: 2017-07-16 | Discharge: 2017-07-16 | Disposition: A | Discharge status: Home / Self Care | Payer: BLUE CROSS/BLUE SHIELD | Attending: Emergency Medicine | Admitting: Emergency Medicine

## 2017-07-16 ENCOUNTER — Emergency Department (HOSPITAL_COMMUNITY): Payer: BLUE CROSS/BLUE SHIELD

## 2017-07-16 ENCOUNTER — Encounter (HOSPITAL_COMMUNITY): Payer: Self-pay | Admitting: Emergency Medicine

## 2017-07-16 DIAGNOSIS — F10229 Alcohol dependence with intoxication, unspecified: Secondary | ICD-10-CM | POA: Diagnosis not present

## 2017-07-16 DIAGNOSIS — Z9119 Patient's noncompliance with other medical treatment and regimen: Secondary | ICD-10-CM | POA: Insufficient documentation

## 2017-07-16 DIAGNOSIS — Z79899 Other long term (current) drug therapy: Secondary | ICD-10-CM | POA: Insufficient documentation

## 2017-07-16 DIAGNOSIS — J449 Chronic obstructive pulmonary disease, unspecified: Secondary | ICD-10-CM | POA: Insufficient documentation

## 2017-07-16 DIAGNOSIS — F17210 Nicotine dependence, cigarettes, uncomplicated: Secondary | ICD-10-CM | POA: Insufficient documentation

## 2017-07-16 DIAGNOSIS — D869 Sarcoidosis, unspecified: Secondary | ICD-10-CM | POA: Insufficient documentation

## 2017-07-16 DIAGNOSIS — F10920 Alcohol use, unspecified with intoxication, uncomplicated: Secondary | ICD-10-CM

## 2017-07-16 DIAGNOSIS — I4581 Long QT syndrome: Secondary | ICD-10-CM | POA: Diagnosis not present

## 2017-07-16 DIAGNOSIS — Z91199 Patient's noncompliance with other medical treatment and regimen due to unspecified reason: Secondary | ICD-10-CM

## 2017-07-16 DIAGNOSIS — R0602 Shortness of breath: Secondary | ICD-10-CM | POA: Diagnosis present

## 2017-07-16 LAB — COMPREHENSIVE METABOLIC PANEL
ALBUMIN: 3.9 g/dL (ref 3.5–5.0)
ALK PHOS: 90 U/L (ref 38–126)
ALT: 42 U/L (ref 14–54)
ANION GAP: 10 (ref 5–15)
AST: 161 U/L — ABNORMAL HIGH (ref 15–41)
BUN: <5 mg/dL — ABNORMAL LOW (ref 6–20)
CALCIUM: 9 mg/dL (ref 8.9–10.3)
CO2: 25 mmol/L (ref 22–32)
Chloride: 102 mmol/L (ref 101–111)
Creatinine, Ser: 0.47 mg/dL (ref 0.44–1.00)
GFR calc Af Amer: >60 mL/min (ref >60)
GFR calc non Af Amer: >60 mL/min (ref >60)
GLUCOSE: 89 mg/dL (ref 65–99)
POTASSIUM: 3.6 mmol/L (ref 3.5–5.1)
SODIUM: 137 mmol/L (ref 135–145)
Total Bilirubin: 0.5 mg/dL (ref 0.3–1.2)
Total Protein: 8.3 g/dL — ABNORMAL HIGH (ref 6.5–8.1)

## 2017-07-16 LAB — URINALYSIS, ROUTINE W REFLEX MICROSCOPIC
Bilirubin Urine: NEGATIVE
Glucose, UA: NEGATIVE mg/dL
Hgb urine dipstick: NEGATIVE
Ketones, ur: NEGATIVE mg/dL
NITRITE: POSITIVE — AB
PH: 6 (ref 5.0–8.0)
PROTEIN: NEGATIVE mg/dL
SPECIFIC GRAVITY, URINE: 1.003 — AB (ref 1.005–1.030)

## 2017-07-16 LAB — CBC
HCT: 33.7 % — ABNORMAL LOW (ref 36.0–46.0)
HEMOGLOBIN: 11.8 g/dL — AB (ref 12.0–15.0)
MCH: 36.2 pg — AB (ref 26.0–34.0)
MCHC: 35 g/dL (ref 30.0–36.0)
MCV: 103.4 fL — AB (ref 78.0–100.0)
Platelets: 105 10*3/uL — ABNORMAL LOW (ref 150–400)
RBC: 3.26 MIL/uL — ABNORMAL LOW (ref 3.87–5.11)
RDW: 12.6 % (ref 11.5–15.5)
WBC: 4.1 10*3/uL (ref 4.0–10.5)

## 2017-07-16 LAB — RAPID URINE DRUG SCREEN, HOSP PERFORMED
AMPHETAMINES: NOT DETECTED
BENZODIAZEPINES: NOT DETECTED
Barbiturates: NOT DETECTED
Cocaine: NOT DETECTED
Opiates: NOT DETECTED
TETRAHYDROCANNABINOL: NOT DETECTED

## 2017-07-16 LAB — CBG MONITORING, ED: Glucose-Capillary: 95 mg/dL (ref 65–99)

## 2017-07-16 LAB — PREGNANCY, URINE: PREG TEST UR: NEGATIVE

## 2017-07-16 LAB — ETHANOL: Alcohol, Ethyl (B): 389 mg/dL (ref <5)

## 2017-07-16 MED ORDER — VITAMIN B-1 100 MG PO TABS
100.0000 mg | ORAL_TABLET | Freq: Once | ORAL | Status: AC
  Administered 2017-07-16: 100 mg via ORAL
  Filled 2017-07-16: qty 1

## 2017-07-16 MED ORDER — SODIUM CHLORIDE 0.9 % IV BOLUS (SEPSIS)
1000.0000 mL | Freq: Once | INTRAVENOUS | Status: AC
  Administered 2017-07-16: 1000 mL via INTRAVENOUS

## 2017-07-16 MED ORDER — IPRATROPIUM-ALBUTEROL 0.5-2.5 (3) MG/3ML IN SOLN
3.0000 mL | Freq: Once | RESPIRATORY_TRACT | Status: AC
  Administered 2017-07-16: 3 mL via RESPIRATORY_TRACT
  Filled 2017-07-16: qty 3

## 2017-07-16 MED ORDER — CEPHALEXIN 500 MG PO CAPS: 500.0000 mg | ORAL_CAPSULE | Freq: Two times a day (BID) | ORAL | 0 refills | Status: AC

## 2017-07-16 MED ORDER — DEXTROSE 5 % IV SOLN
1.0000 g | Freq: Once | INTRAVENOUS | Status: AC
  Administered 2017-07-16: 1 g via INTRAVENOUS
  Filled 2017-07-16: qty 10

## 2017-07-16 NOTE — ED Provider Notes (Signed)
  Physical Exam  BP (!) 127/95   Pulse (!) 50   Temp 97.7 F (36.5 C) (Oral)   Resp (!) 22   SpO2 100%   Physical Exam  ED Course  Procedures  Sign out from [facility], PA-C  SOB x months. Hx COPD. Weakness. ETOH on board   Work up unremarkable. UA shows UTI. Given Rocephin  Plan is to DC when clinically sober.   1700: Pt clinically sober. Ambulated without difficulty. At time of discharge, Patient is in no acute distress. Vital Signs are stable. Patient is able to ambulate. Patient able to tolerate PO.     Shary Decamp, PA-C 07/16/17 1752    Drenda Freeze, MD 07/17/17 [phone]

## 2017-07-16 NOTE — ED Provider Notes (Signed)
[facility] DEPT Provider Note   CSN: [id number] Arrival date & time: 07/16/17  1601     History   Chief Complaint Chief Complaint  Patient presents with  . Shortness of Breath  . Alcohol Intoxication    HPI Margaret Schneider is a 41 y.o. female with a history of sarcoidosis, COPD, who presents with her daughter for evaluation today.  Running to her daughter patient has been very weak recently having increased shortness of breath over the past few months and reportedly does not eat food, and only drinks beer. Patient's daughter reports that when she went to her mother's house today she found her slumped over surrounded by bottles. Patient states that she drank two  40 ounce glass bottles of beer last night.  Patient is a poor historian.  Patient daughter says the patient is generally off balance whenever she sees her walk.  HPI  Past Medical History:  Diagnosis Date  . Abdominal pain, generalized   . Decreased visual acuity   . Dental caries   . Dyspnea   . Gastritis   . Routine gynecological examination   . Sarcoidosis   . STD (sexually transmitted disease) complicating pregnancy, delivered   . Tobacco abuse     Patient Active Problem List   Diagnosis Date Noted  . Long Q-T syndrome 07/16/2017  . COPD (chronic obstructive pulmonary disease) ([place]) 03/13/2015  . 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 SEXUALLY TRANSMITTED DISEASE 05/19/2008  . VISUAL ACUITY, DECREASED 05/19/2008  . DENTAL CARIES 05/19/2008  . ABDOMINAL PAIN, GENERALIZED 05/19/2008  . TOBACCO ABUSE 12/21/2007  . GASTRITIS 12/21/2007    Past Surgical History:  Procedure Laterality Date  . sweat glands removed    . TUBAL LIGATION      OB History    No data available       Home Medications    Prior to Admission medications   Medication Sig Start Date End Date Taking? Authorizing Provider  albuterol (VENTOLIN HFA) 108 (90 BASE) MCG/ACT inhaler Inhale 2 puffs into the lungs every 6 (six) hours  as needed. 01/17/15   Parrett, Fonnie Mu, NP  budesonide-formoterol (SYMBICORT) 160-4.5 MCG/ACT inhaler Inhale 2 puffs into the lungs 2 (two) times daily. Patient taking differently: Inhale 2 puffs into the lungs 2 (two) times daily as needed (for shortness of breath).  01/17/15   Parrett, Fonnie Mu, NP  cephALEXin (KEFLEX) 500 MG capsule Take 1 capsule (500 mg total) by mouth 2 (two) times daily. 07/16/17 07/21/17  Lorin Glass, PA-C  folic acid (FOLVITE) 1 MG tablet Take 1 tablet (1 mg total) by mouth daily. 10/24/15   Street, [place], PA-C  HYDROcodone-acetaminophen (NORCO) 5-325 MG tablet Take 1 tablet by mouth every 6 (six) hours as needed for moderate pain. 08/03/16   Nat Christen, MD  Multiple Vitamins-Minerals (MULTIVITAMIN WITH MINERALS) tablet Take 1 tablet by mouth daily. 10/24/15   Street, [place], PA-C  predniSONE (DELTASONE) 5 MG tablet Take 5 mg by mouth daily with breakfast.    [provider]    Family History Family History  Problem Relation Age of Onset  . Asthma Maternal Aunt        x 2  . Rheum arthritis Maternal Grandfather   . Diabetes Maternal Grandfather   . Cancer Unknown   . Breast cancer Maternal Aunt        x 2  . Diabetes Cousin     Social History Social History  Substance Use Topics  . Smoking status: Current  Every Day Smoker    Packs/day: 0.20    Years: 21.00    Types: Cigarettes  . Smokeless tobacco: Never Used     Comment: started smoking at age 65  . Alcohol use Yes     Comment: 1 40oz beer three times weekly     Allergies   Patient has no known allergies.   Review of Systems Review of Systems  Unable to perform ROS: Other   Patient intoxicated  Physical Exam Updated Vital Signs BP 128/90   Pulse 66   Temp 97.7 F (36.5 C) (Oral)   Resp 15   SpO2 100%   Physical Exam  Constitutional: She appears well-developed. No distress.  Appears older than stated age  HENT:  Head: Normocephalic and atraumatic. Head is without  raccoon's eyes and without Battle's sign.  Right Ear: External ear normal. No hemotympanum.  Left Ear: External ear normal. No hemotympanum.  Mouth/Throat: Oropharynx is clear and moist.  Eyes: Conjunctivae are normal. Right eye exhibits no discharge. Left eye exhibits no discharge. No scleral icterus.  Neck: Normal range of motion. Neck supple.  Cardiovascular: Normal rate, regular rhythm, normal heart sounds and intact distal pulses.  Exam reveals no friction rub.   No murmur heard. Pulmonary/Chest: Effort normal. No stridor. No respiratory distress. She has wheezes. She has no rales.  Abdominal: Soft. Bowel sounds are normal. She exhibits no distension. There is no tenderness.  Musculoskeletal: She exhibits no edema or deformity.  Neurological: She is alert. She has normal strength. She exhibits normal muscle tone.  Neurologically grossly intact, no facial droop, patient moving all extremities, is able to follow simple commands.   Skin: Skin is warm and dry. She is not diaphoretic.  Psychiatric: She has a normal mood and affect. Her behavior is normal.  Nursing note and vitals reviewed.    ED Treatments / Results  Labs (all labs ordered are listed, but only abnormal results are displayed) Labs Reviewed  COMPREHENSIVE METABOLIC PANEL - Abnormal; Notable for the following:       Result Value   BUN <5 (*)    Total Protein 8.3 (*)    AST 161 (*)    All other components within normal limits  ETHANOL - Abnormal; Notable for the following:    Alcohol, Ethyl (B) 389 (*)    All other components within normal limits  CBC - Abnormal; Notable for the following:    RBC 3.26 (*)    Hemoglobin 11.8 (*)    HCT 33.7 (*)    MCV 103.4 (*)    MCH 36.2 (*)    Platelets 105 (*)    All other components within normal limits  URINALYSIS, ROUTINE W REFLEX MICROSCOPIC - Abnormal; Notable for the following:    APPearance HAZY (*)    Specific Gravity, Urine 1.003 (*)    Nitrite POSITIVE (*)     Leukocytes, UA TRACE (*)    Bacteria, UA RARE (*)    Squamous Epithelial / LPF 0-5 (*)    All other components within normal limits  URINE CULTURE  RAPID URINE DRUG SCREEN, HOSP PERFORMED  PREGNANCY, URINE  CBG MONITORING, ED    EKG  EKG Interpretation  Date/Time:  Wednesday July 16 2017 09:07:23 EDT Ventricular Rate:  79 PR Interval:  150 QRS Duration: 70 QT Interval:  432 QTC Calculation: 495 R Axis:   43 Text Interpretation:  Normal sinus rhythm Prolonged QT Abnormal ECG No significant change since last tracing Confirmed by Darl Householder,  Fenton Malling ([id number]) on 07/16/2017 9:51:21 AM       Radiology Dg Chest Portable 1 View  Result Date: 07/16/2017 CLINICAL DATA:  Shortness of breath for 1 week. History of sarcoidosis and smoking. EXAM: PORTABLE CHEST 1 VIEW COMPARISON:  01/17/2015 FINDINGS: The cardiomediastinal silhouette is within normal limits. The lungs are normally to mildly hyperinflated and clear. Mild thoracic dextroscoliosis is again noted. IMPRESSION: No active disease. Electronically Signed   By: Logan Bores M.D.   On: 07/16/2017 11:19    Procedures Procedures (including critical care time)  Medications Ordered in ED Medications  sodium chloride 0.9 % bolus 1,000 mL (0 mLs Intravenous Stopped 07/16/17 1702)  ipratropium-albuterol (DUONEB) 0.5-2.5 (3) MG/3ML nebulizer solution 3 mL (3 mLs Nebulization Given 07/16/17 1128)  cefTRIAXone (ROCEPHIN) 1 g in dextrose 5 % 50 mL IVPB (0 g Intravenous Stopped 07/16/17 1323)  thiamine (VITAMIN B-1) tablet 100 mg (100 mg Oral Given 07/16/17 1254)     Initial Impression / Assessment and Plan / ED Course  I have reviewed the triage vital signs and the nursing notes.  Pertinent labs & imaging results that were available during my care of the patient were reviewed by me and considered in my medical decision making (see chart for details).  Clinical Course as of Jul 16 1806  Wed Jul 16, 2017  1548 Checked patient, she does not remember  meeting me earlier.  She reports that recently she has been feeling tired but that is not a new symptom for her.  The importance of following up with pulm, PCP, stopping drinking, and eating right were all discussed with the patient.   [EH]  1602 Signed out patient to Shary Decamp PA-C  [EH]    Clinical Course User Index [EH] Lorin Glass, PA-C   [place] presents for SOB, alcohol intoxication.  She ws found to be clinically intoxicated with blood alcohol level of 389.  Labs significant for mild anemia, elevated AST, low platelets, UA hazy, nitrite positive, trace leuks, rare bacteria, will treat for UTI and give rx for antibiotics, urine culture pending.  Patient was observed in the ED.  At the time of shift change patient was more sober but still clinically intoxicated.   Her wheezes improved after a duo neb treatment and did not return during her time in the ED.  Patient does not have any signs consitent with head trauma and no known history of falls.  Neuro exam is non focal, however limited due to patient's level of intoxication.  Case management was consulted for assistance in offering resources, appreciate their input.  At shift change care was transferred to [facility] PA-C who will follow pending studies, re-evaulate and determine disposition.       Final Clinical Impressions(s) / ED Diagnoses   Final diagnoses:  Long Q-T syndrome  Alcoholic intoxication without complication (HCC)  Medical non-compliance    New Prescriptions New Prescriptions   CEPHALEXIN (KEFLEX) 500 MG CAPSULE    Take 1 capsule (500 mg total) by mouth 2 (two) times daily.     Lorin Glass, PA-C 07/16/17 1815    Drenda Freeze, MD 07/17/17 2159

## 2017-07-16 NOTE — ED Triage Notes (Addendum)
Sob  X  Months  , per family  Pt does not eat , feels weak  Very poor historian does states that she drinks a lot mostly beer

## 2017-07-18 LAB — URINE CULTURE

## 2017-07-19 ENCOUNTER — Telehealth: Payer: Self-pay

## 2017-07-19 NOTE — Telephone Encounter (Signed)
Post ED Visit - Positive Culture Follow-up  Culture report reviewed by antimicrobial stewardship pharmacist:  []  Elenor Quinones, Pharm.D. []  Heide Guile, Pharm.D., BCPS AQ-ID []  Parks Neptune, Pharm.D., BCPS []  Alycia Rossetti, Pharm.D., BCPS []  [place], Pharm.D., BCPS, AAHIVP []  Legrand Como, Pharm.D., BCPS, AAHIVP []  Salome Arnt, PharmD, BCPS []  Dimitri Ped, PharmD, BCPS []  Vincenza Hews, PharmD, BCPS [facility] Pharm D Positive urine culture Treated with Cephalexin, organism sensitive to the same and no further patient follow-up is required at this time.  Genia Del 07/19/2017, 11:23 AM

## 2018-01-04 ENCOUNTER — Encounter (HOSPITAL_COMMUNITY): Payer: Self-pay | Admitting: Emergency Medicine

## 2018-01-04 ENCOUNTER — Other Ambulatory Visit: Payer: Self-pay

## 2018-01-04 DIAGNOSIS — R0981 Nasal congestion: Secondary | ICD-10-CM | POA: Diagnosis not present

## 2018-01-04 DIAGNOSIS — N939 Abnormal uterine and vaginal bleeding, unspecified: Secondary | ICD-10-CM | POA: Diagnosis not present

## 2018-01-04 DIAGNOSIS — M23051 Cystic meniscus, posterior horn of lateral meniscus, right knee: Secondary | ICD-10-CM | POA: Diagnosis not present

## 2018-01-04 DIAGNOSIS — Z5321 Procedure and treatment not carried out due to patient leaving prior to being seen by health care provider: Secondary | ICD-10-CM | POA: Diagnosis not present

## 2018-01-04 LAB — I-STAT CHEM 8, ED
BUN: <3 mg/dL — ABNORMAL LOW (ref 6–20)
Calcium, Ion: 1.07 mmol/L — ABNORMAL LOW (ref 1.15–1.40)
Chloride: 98 mmol/L — ABNORMAL LOW (ref 101–111)
Creatinine, Ser: 0.7 mg/dL (ref 0.44–1.00)
Glucose, Bld: 80 mg/dL (ref 65–99)
HEMATOCRIT: 33 % — AB (ref 36.0–46.0)
Hemoglobin: 11.2 g/dL — ABNORMAL LOW (ref 12.0–15.0)
POTASSIUM: 3.6 mmol/L (ref 3.5–5.1)
Sodium: 137 mmol/L (ref 135–145)
TCO2: 25 mmol/L (ref 22–32)

## 2018-01-04 LAB — I-STAT BETA HCG BLOOD, ED (MC, WL, AP ONLY)

## 2018-01-04 NOTE — ED Triage Notes (Addendum)
Multiple complaints-- C/o recurrent right lateral meniscal cyst for about 1 month.  She has seen Dr. Ninfa Linden for same in the past and states her cousin drained it last time at home.  Also reports nasal congestion x 4 days.  C/o heavy vaginal bleeding x 2 weeks.  Denies abd pain.

## 2018-01-05 ENCOUNTER — Emergency Department (HOSPITAL_COMMUNITY)
Admission: EM | Admit: 2018-01-05 | Discharge: 2018-01-05 | Discharge status: Against Medical Advice | Payer: BLUE CROSS/BLUE SHIELD | Attending: Emergency Medicine | Admitting: Emergency Medicine

## 2018-01-05 NOTE — ED Notes (Signed)
Pt was seen walking out.

## 2018-01-05 NOTE — ED Notes (Signed)
Pt remains in waiting room. Updated on wait for treatment room. Pt wanting to leave.  Family and myself encouraged pt to stay to be seen.
